# Patient Record
Sex: Female | Born: 1940 | Race: Black or African American | Hispanic: No | Smoking: Never smoker
Health system: Southern US, Community
[De-identification: ages and names within clinical notes are randomized; demographics above are authoritative.]

## PROBLEM LIST (undated history)

## (undated) DIAGNOSIS — N3281 Overactive bladder: Secondary | ICD-10-CM

## (undated) DIAGNOSIS — G4700 Insomnia, unspecified: Secondary | ICD-10-CM

## (undated) DIAGNOSIS — H9190 Unspecified hearing loss, unspecified ear: Secondary | ICD-10-CM

## (undated) DIAGNOSIS — F32A Depression, unspecified: Secondary | ICD-10-CM

## (undated) DIAGNOSIS — M179 Osteoarthritis of knee, unspecified: Secondary | ICD-10-CM

## (undated) DIAGNOSIS — I10 Essential (primary) hypertension: Secondary | ICD-10-CM

## (undated) DIAGNOSIS — E785 Hyperlipidemia, unspecified: Secondary | ICD-10-CM

## (undated) DIAGNOSIS — F329 Major depressive disorder, single episode, unspecified: Secondary | ICD-10-CM

## (undated) DIAGNOSIS — F419 Anxiety disorder, unspecified: Secondary | ICD-10-CM

## (undated) DIAGNOSIS — M1990 Unspecified osteoarthritis, unspecified site: Secondary | ICD-10-CM

## (undated) DIAGNOSIS — T7840XA Allergy, unspecified, initial encounter: Secondary | ICD-10-CM

## (undated) DIAGNOSIS — E559 Vitamin D deficiency, unspecified: Secondary | ICD-10-CM

## (undated) DIAGNOSIS — M545 Low back pain, unspecified: Secondary | ICD-10-CM

## (undated) HISTORY — DX: Unspecified osteoarthritis, unspecified site: M19.90

## (undated) HISTORY — DX: Osteoarthritis of knee, unspecified: M17.9

## (undated) HISTORY — DX: Low back pain, unspecified: M54.50

## (undated) HISTORY — DX: Anxiety disorder, unspecified: F41.9

## (undated) HISTORY — DX: Unspecified hearing loss, unspecified ear: H91.90

## (undated) HISTORY — DX: Depression, unspecified: F32.A

## (undated) HISTORY — DX: Hyperlipidemia, unspecified: E78.5

## (undated) HISTORY — DX: Major depressive disorder, single episode, unspecified: F32.9

## (undated) HISTORY — DX: Overactive bladder: N32.81

## (undated) HISTORY — DX: Essential (primary) hypertension: I10

## (undated) HISTORY — DX: Allergy, unspecified, initial encounter: T78.40XA

## (undated) HISTORY — DX: Vitamin D deficiency, unspecified: E55.9

## (undated) HISTORY — DX: Insomnia, unspecified: G47.00

---

## 1970-05-14 HISTORY — PX: ABDOMINAL HYSTERECTOMY: SHX81

## 1998-11-09 ENCOUNTER — Other Ambulatory Visit: Admission: RE | Admit: 1998-11-09 | Discharge: 1998-11-09 | Payer: Self-pay | Admitting: Podiatry

## 2000-02-02 ENCOUNTER — Ambulatory Visit (HOSPITAL_COMMUNITY): Admission: RE | Admit: 2000-02-02 | Discharge: 2000-02-02 | Payer: Self-pay | Admitting: Family Medicine

## 2000-02-02 ENCOUNTER — Encounter: Payer: Self-pay | Admitting: Family Medicine

## 2003-10-14 ENCOUNTER — Other Ambulatory Visit: Admission: RE | Admit: 2003-10-14 | Discharge: 2003-10-14 | Payer: Self-pay | Admitting: Family Medicine

## 2004-02-15 ENCOUNTER — Encounter: Admission: RE | Admit: 2004-02-15 | Discharge: 2004-02-15 | Payer: Self-pay | Admitting: Gastroenterology

## 2004-03-15 ENCOUNTER — Ambulatory Visit (HOSPITAL_COMMUNITY): Admission: RE | Admit: 2004-03-15 | Discharge: 2004-03-15 | Payer: Self-pay | Admitting: Gastroenterology

## 2004-03-15 ENCOUNTER — Encounter (INDEPENDENT_AMBULATORY_CARE_PROVIDER_SITE_OTHER): Payer: Self-pay | Admitting: Specialist

## 2004-03-22 ENCOUNTER — Observation Stay (HOSPITAL_COMMUNITY): Admission: RE | Admit: 2004-03-22 | Discharge: 2004-03-23 | Payer: Self-pay | Admitting: General Surgery

## 2004-03-22 ENCOUNTER — Encounter (INDEPENDENT_AMBULATORY_CARE_PROVIDER_SITE_OTHER): Payer: Self-pay | Admitting: Specialist

## 2004-10-12 HISTORY — PX: CHOLECYSTECTOMY: SHX55

## 2005-07-30 ENCOUNTER — Encounter (INDEPENDENT_AMBULATORY_CARE_PROVIDER_SITE_OTHER): Payer: Self-pay | Admitting: *Deleted

## 2005-07-30 ENCOUNTER — Ambulatory Visit (HOSPITAL_COMMUNITY): Admission: RE | Admit: 2005-07-30 | Discharge: 2005-07-30 | Payer: Self-pay | Admitting: Gastroenterology

## 2006-10-02 ENCOUNTER — Encounter: Admission: RE | Admit: 2006-10-02 | Discharge: 2006-10-02 | Payer: Self-pay | Admitting: Family Medicine

## 2006-10-09 ENCOUNTER — Encounter: Admission: RE | Admit: 2006-10-09 | Discharge: 2006-10-09 | Payer: Self-pay | Admitting: Family Medicine

## 2010-09-29 NOTE — Op Note (Signed)
Mariah Lindsey, Mariah Lindsey           ACCOUNT NO.:  [id number]   MEDICAL RECORD NO.:  [id number]          PATIENT TYPE:  AMB   LOCATION:  ENDO                         FACILITY:  [facility]   PHYSICIAN:  Anselmo Rod, M.D.  DATE OF BIRTH:  04/11/1941   DATE OF PROCEDURE:  03/15/2004  DATE OF DISCHARGE:                                 OPERATIVE REPORT   PROCEDURE PERFORMED:  Colonoscopy with snare polypectomy x1 and cold biopsy  x1.   ENDOSCOPIST:  Anselmo Rod, M.D.   INSTRUMENT USED:  Olympus video colonoscope.   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   PREPROCEDURE PREPARATION:  Informed consent was procured from the patient.  The patient fasted for eight hours prior to the procedure and prepped with a  bottle of magnesium citrate and a gallop of GoLYTELY the night prior to the  procedure.   PREPROCEDURE PHYSICAL:  VITAL SIGNS:  Stable vital signs.  NECK:  Supple.  CHEST:  Clear to auscultation.  CARDIOVASCULAR:  S1 and S2 regular.  ABDOMEN:  Soft with normal bowel sounds.   DESCRIPTION OF PROCEDURE:  The patient was placed in left lateral decubitus  position, sedated with 130 mg of Demerol and 17 mg of Versed in slow  incremental doses.  Once the patient was adequately sedated and maintained  on low flow oxygen and continuous cardiac monitoring, the Olympus video  colonoscope was advanced from the rectum to the cecum with extreme  difficulty.  The patient's position was changed from the left lateral to  supine, right lateral and prone positions to reach the cecum.  There was a  large amount of solid stool in certain areas of the colon.  Multiple  washings were done.  The appendiceal orifice and ileocecal valve were  clearly visualized and photographed where small lesions could have been  missed.  A large pedunculated polyp was snared from the  proximal right  colon. This was done carefully with cut and coag method.  Another  pedunculated polyp was seen in the mid right colon.  Epinephrine 3 mL were  injected in the base of the polyp and several attempts were made to remove  the polyp but there was a large amount of solid stool around the polyp.  Therefore, plans were made to reprep the patient and remove the polyp at  another time.  The small sessile polyp was biopsied from the distal right  colon.  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  Retroflexion in the rectum revealed no abnormalities. The patient tolerated  the procedure well without any immediate complications.   IMPRESSION:  1.  Large amount of solid stool in the colon, very limited examination.  2.  Large pedunculated polyp snared from proximal right colon.  3.  No other __________ or polyps snared from distal right colon.  4.  Melanosis coli noted throughout the colon.   RECOMMENDATIONS:  1.  Await pathology results.  2.  Avoid all nonsteroidals including aspirin for now.  3.  Outpatient follow-up after repeat colonoscopy.  4.  Further recommendations will be made at that time.       JNM/MEDQ  D:  03/15/2004  T:  03/15/2004  Job:  [id number]   cc:   Renaye Rakers, M.D.  [phone] [address], [address]  [place]  [place] [address]  Fax: [phone]   Leonie Man, M.D.  [address]  [address]  [place]  [place] [address]  Fax: [phone]

## 2010-09-29 NOTE — Op Note (Signed)
Mariah Lindsey, Mariah Lindsey           ACCOUNT NO.:  [id number]   MEDICAL RECORD NO.:  [id number]          PATIENT TYPE:  AMB   LOCATION:  DAY                          FACILITY:  [facility]   PHYSICIAN:  Leonie Man, M.D.   DATE OF BIRTH:  1940/09/16   DATE OF PROCEDURE:  03/22/2004  DATE OF DISCHARGE:                                 OPERATIVE REPORT   PREOPERATIVE DIAGNOSIS:  Chronic calculous cholecystitis.   POSTOPERATIVE DIAGNOSIS:  Chronic calculous cholecystitis.   PROCEDURE:  Laparoscopic cholecystectomy with intraoperative cholangiogram.   SURGEON:  Leonie Man, M.D.   ASSISTANT:  Currie Paris, M.D.   ANESTHESIA:  General.   NOTE:  Mariah Lindsey is a 70 year old obese patient referred courtesy of  Jyothi Nat Mann, M.D., for recurrent symptoms of upper abdominal pain  associated with nausea and vomiting.  She was evaluated by ultrasound and  noted to have cholelithiasis.  Her liver function studies on review are  within normal limits.  She has not had any chills or fever or jaundice.   The patient comes to the operating room now after the risks and potential  benefits of surgery have been fully discussed, all questions answered, and  consent obtained.   PROCEDURE:  Following the induction of satisfactory general anesthesia with  the patient positioned supinely, the abdomen was routinely prepped and  draped to be included in the sterile operative field.  The open laparoscopy  was created at the umbilicus with insertion of a Hasson cannula and  insufflation to 14 mmHg pressure using carbon dioxide.  On visual  exploration, the liver was somewhat encased in multiple adhesions along the  liver edge.  The liver edge itself was sharp.  There were some adhesions of  the duodenum and the stomach up to the undersurface of the liver.  None of  the small or large intestine otherwise viewed appeared to be normal.  Pelvic  organs were not visualized.   Under direct vision  epigastric and lateral ports were placed.  The patient  was then placed in the head-up position.  Adhesiolysis of the perihepatic  lesions was carried out.  The gallbladder was isolated and retracted  cephalad, dissection carried down to the region of the ampulla, where the  cystic artery and cystic duct were serially dissected free, the cystic  artery traced to its entry into the gallbladder wall, the cystic duct traced  to the gallbladder-cystic duct junction.  The cystic duct was then clipped  proximally and opened.  A cystic duct cholangiogram was then carried out  using a Reddick catheter, which was passed into the abdomen through a 14-  gauge Angiocath.  The resulting cholangiogram showed free flow of one-half  strength Hypaque directly into the duodenum with normal-caliber extrahepatic  and intrahepatic ducts.  There were no filling defects noted.  The  cholangiocatheter was removed and the cystic duct was doubly clipped and  transected, the cystic artery also doubly clipped and transected and the  gallbladder dissected free from the liver bed using electrocautery and  maintaining hemostasis throughout the course of the dissection.  At the end  of the dissection, all areas within the liver bed were checked for  hemostasis and noted to be dry.  The right upper quadrant was thoroughly  irrigated with multiple aliquots of normal saline and aspirated free.  The  gallbladder was placed in an EndoCatch and removed through the umbilical  wound without difficulty.  Sponge, instrument, and sharp counts were  verified and pneumoperitoneum allowed to deflate and the wounds closed in  layers as follows:  The umbilical wound in two layers with 0 Vicryl and 4-0  Monocryl, epigastric and lateral flank wound closed with 4-0 Monocryl  sutures.  All wounds were then reinforced with Dermabond, the anesthetic  reversed, the patient removed from the operating room to the recovery room  in stable  condition.  She tolerated the procedure well.     Patr   PB/MEDQ  D:  03/22/2004  T:  03/22/2004  Job:  [id number]   cc:   Anselmo Rod, M.D.  [address]  [place]  [place] [address]  Fax: [phone]

## 2010-09-29 NOTE — Op Note (Signed)
[name], Mariah Lindsey           ACCOUNT NO.:  [id number]   MEDICAL RECORD NO.:  [id number]          PATIENT TYPE:  AMB   LOCATION:  ENDO                         FACILITY:  [facility]   PHYSICIAN:  Anselmo Rod, M.D.  DATE OF BIRTH:  [birth year]   DATE OF PROCEDURE:  07/30/2005  DATE OF DISCHARGE:                                 OPERATIVE REPORT   PROCEDURE PERFORMED:  Colonoscopy with snare polypectomy x2.   ENDOSCOPIST:  Charna Elizabeth, M.D.   INSTRUMENT USED:  Olympus video colonoscope.   INDICATIONS FOR PROCEDURE:  A 70 year old African-American female with  history of left lower quadrant pain and a personal history of adenomatous  polyps, undergoing a repeat colonoscopy for surveillance purposes.  Rule out  recurrent polyps.   PREPROCEDURE PREPARATION:  Informed consent was procured from the patient.  The patient was fasted for four hours prior to the procedure and prepped  with OsmoPrep pills the night of and the morning of the procedure.  The  risks and benefits of the procedure including a 10% miss rate for cancer or  polyps was discussed with the patient as well.  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 were discussed with her in  great detail.  She refused blood products if need arises but requested that  alternative plasma expanders be used if needed.   PREPROCEDURE PHYSICAL:  The patient had stable vital signs.  Neck supple.  Chest clear to auscultation.  S1 and S2 regular.  Abdomen soft with normal  bowel sounds.   DESCRIPTION OF PROCEDURE:  The patient was placed in left lateral decubitus  position and sedated with 150 mcg of fentanyl and 11 mg of Versed in slow  incremental doses.  Once the patient was adequately sedated and maintained  on low flow oxygen and continuous cardiac monitoring, the Olympus video  colonoscope was advanced from the rectum to the cecum with difficulty.  The  patient had a somewhat tortuous colon.  The  appendicular orifice and  ileocecal valve were clearly visualized and photographed. There was some  residual stool in the colon.  Multiple washes were done.  A small sessile  polyp was removed by a hot snare from the proximal right colon.  A  pedunculated polyp was removed from the distal right colon by snare  polypectomy as well (hot snare x1).  There were a few early scattered  diverticula noted.  No masses or polyps were seen besides the ones mentioned  in the right colon.  Transverse colon and left colon appeared relatively  normal.  Retroflexion in the rectum revealed no abnormalities.   IMPRESSION:  1.  Two polyps, one sessile and one pedunculated removed by hot snare from      the proximal right colon.  2.  Few early scattered diverticula.  3.  No abnormalities noted on retroflexion in the rectum.   RECOMMENDATIONS:  1.  Await pathology results.  2.  Avoid all nonsteroidals including aspirin for now, especially over the      next four weeks.  3.  Repeat colonoscopy depending on pathology results.  4.  A high fiber diet with liberal fluid intake, brochures on diverticulosis      have been given for patient's education.  5.  Outpatient followup as need arises in the future.      Anselmo Rod, M.D.  Electronically Signed     JNM/MEDQ  D:  07/30/2005  T:  07/31/2005  Job:  [id number]   cc:   Renaye Rakers, M.D.  Fax: [phone]

## 2011-06-04 ENCOUNTER — Encounter: Payer: Self-pay | Admitting: *Deleted

## 2011-06-05 ENCOUNTER — Encounter: Payer: Self-pay | Admitting: *Deleted

## 2011-06-18 ENCOUNTER — Encounter: Payer: Medicare Other | Admitting: *Deleted

## 2011-06-18 ENCOUNTER — Encounter: Payer: Medicare Other | Attending: Internal Medicine | Admitting: *Deleted

## 2011-06-18 DIAGNOSIS — Z713 Dietary counseling and surveillance: Secondary | ICD-10-CM | POA: Insufficient documentation

## 2011-06-18 DIAGNOSIS — E119 Type 2 diabetes mellitus without complications: Secondary | ICD-10-CM | POA: Insufficient documentation

## 2011-06-20 ENCOUNTER — Encounter: Payer: Self-pay | Admitting: *Deleted

## 2011-06-20 NOTE — Progress Notes (Signed)
  Patient was seen on 06/18/11 for the first of a series of three diabetes self-management courses at the [facility]. The following learning objectives were met by the patient during this course:   Defines the role of glucose and insulin  Identifies type of diabetes and pathophysiology  Defines the diagnostic criteria for diabetes and prediabetes  States the risk factors for Type 2 Diabetes  States the symptoms of Type 2 Diabetes  Defines Type 2 Diabetes treatment goals  Defines Type 2 Diabetes treatment options  States the rationale for glucose monitoring  Identifies A1C, glucose targets, and testing times  Identifies proper sharps disposal  Defines the purpose of a diabetes food plan  Identifies carbohydrate food groups  Defines effects of carbohydrate foods on glucose levels  Identifies carbohydrate choices/grams/food labels  States benefits of physical activity and effect on glucose  Review of suggested activity guidelines  Last A1c: Unknown - Pt reports "I think it's a 10-something"  Handouts given during class include:  Type 2 Diabetes: Basics Book  My Food Plan Book  Food and Activity Log  My Plate Planner  Patient has established the following initial goals:  Increase exercise.  Follow a diabetes meal plan.  Monitor blood glucose.  Take medications appropriately.  Keep doctor's appointments.  Lose weight.  Follow-Up Plan: Patient to schedule Core Diabetes Courses II and III or follow up prn.

## 2011-06-20 NOTE — Patient Instructions (Signed)
Patient to schedule Core Diabetes Courses II and III or follow up prn.  

## 2011-07-17 DIAGNOSIS — Z79899 Other long term (current) drug therapy: Secondary | ICD-10-CM | POA: Diagnosis not present

## 2011-07-17 DIAGNOSIS — I10 Essential (primary) hypertension: Secondary | ICD-10-CM | POA: Diagnosis not present

## 2011-07-17 DIAGNOSIS — IMO0001 Reserved for inherently not codable concepts without codable children: Secondary | ICD-10-CM | POA: Diagnosis not present

## 2011-07-24 ENCOUNTER — Encounter: Payer: Medicare Other | Attending: Internal Medicine | Admitting: Dietician

## 2011-07-24 DIAGNOSIS — Z713 Dietary counseling and surveillance: Secondary | ICD-10-CM | POA: Insufficient documentation

## 2011-07-24 DIAGNOSIS — E119 Type 2 diabetes mellitus without complications: Secondary | ICD-10-CM | POA: Diagnosis not present

## 2011-07-25 NOTE — Progress Notes (Signed)
  Patient was seen on 07/24/2011 for the second of a series of three diabetes self-management courses at the [facility]. The following learning objectives were met by the patient during this course:   Explain basic nutrition maintenance and quality assurance  Describe causes, symptoms and treatment of hypoglycemia and hyperglycemia  Explain how to manage diabetes during illness  Describe the importance of good nutrition for health and healthy eating strategies  List strategies to follow meal plan when dining out  Describe the effects of alcohol on glucose and how to use it safely  Describe problem solving skills for day-to-day glucose challenges  Describe strategies to use when treatment plan needs to change  Identify important factors involved in successful weight loss  Describe ways to remain physically active  Describe the impact of regular activity on insulin resistance  Patient updated their initials goals from Core Class I to include:Agreed to contemplate her goal and finalize goals at Core Class 3.  ReliOnGlucose tablets given in class (1 packet);  Discussed the use of glucose tablets, but did not distribute a sample.  Handouts given in class:  Refrigerator magnet for Sick Day Guidelines  [facility] Oral medication/insulin handout  Follow-Up Plan: Patient will attend the final class of the ADA Diabetes Self-Care Education.

## 2011-07-31 ENCOUNTER — Ambulatory Visit: Payer: Self-pay

## 2011-08-28 ENCOUNTER — Encounter: Payer: Medicare Other | Attending: Internal Medicine | Admitting: Dietician

## 2011-08-28 DIAGNOSIS — Z713 Dietary counseling and surveillance: Secondary | ICD-10-CM | POA: Diagnosis not present

## 2011-08-28 DIAGNOSIS — E119 Type 2 diabetes mellitus without complications: Secondary | ICD-10-CM | POA: Diagnosis not present

## 2011-08-30 NOTE — Progress Notes (Signed)
  Patient was seen on 08/28/2011 for the third of a series of three diabetes self-management courses at the [facility]. The following learning objectives were met by the patient during this course:    Describe how diabetes changes over time   Identify diabetes complications and ways to prevent them   Describe strategies that can promote heart health including lowering blood pressure and cholesterol   Describe strategies to lower dietary fat and sodium in the diet   Identify physical activities that benefit cardiovascular health   Evaluate success in meeting personal goal   Describe the belief that they can live successfully with diabetes day to day   Establish 2-3 goals that they will plan to diligently work on until they return for the free 4-month follow-up visit  The following handouts were given in class:  3 Month Follow Up Visit handout  Goal setting handout  Class evaluation form  Your patient has established the following 3 month goals for diabetes self-care:  Reduce fat intake in my diet  Take my medication in the morning around 10:00 AM  Test my glucose at least 1 time per day.  Follow-Up Plan: Patient will attend a 3 month follow-up visit for diabetes self-management education.

## 2011-09-07 DIAGNOSIS — Z79899 Other long term (current) drug therapy: Secondary | ICD-10-CM | POA: Diagnosis not present

## 2011-09-07 DIAGNOSIS — M25569 Pain in unspecified knee: Secondary | ICD-10-CM | POA: Diagnosis not present

## 2011-09-07 DIAGNOSIS — I10 Essential (primary) hypertension: Secondary | ICD-10-CM | POA: Diagnosis not present

## 2011-09-13 DIAGNOSIS — H2510 Age-related nuclear cataract, unspecified eye: Secondary | ICD-10-CM | POA: Diagnosis not present

## 2011-12-04 DIAGNOSIS — I10 Essential (primary) hypertension: Secondary | ICD-10-CM | POA: Diagnosis not present

## 2011-12-04 DIAGNOSIS — E119 Type 2 diabetes mellitus without complications: Secondary | ICD-10-CM | POA: Diagnosis not present

## 2011-12-04 DIAGNOSIS — Z79899 Other long term (current) drug therapy: Secondary | ICD-10-CM | POA: Diagnosis not present

## 2011-12-12 ENCOUNTER — Ambulatory Visit: Payer: Self-pay | Admitting: Dietician

## 2011-12-15 ENCOUNTER — Ambulatory Visit (INDEPENDENT_AMBULATORY_CARE_PROVIDER_SITE_OTHER): Payer: Medicare Other | Admitting: Family Medicine

## 2011-12-15 VITALS — BP 130/90 | HR 76 | Temp 98.1°F | Resp 16 | Ht 64.0 in | Wt 205.0 lb

## 2011-12-15 DIAGNOSIS — M549 Dorsalgia, unspecified: Secondary | ICD-10-CM

## 2011-12-15 DIAGNOSIS — IMO0001 Reserved for inherently not codable concepts without codable children: Secondary | ICD-10-CM

## 2011-12-15 DIAGNOSIS — R7309 Other abnormal glucose: Secondary | ICD-10-CM

## 2011-12-15 DIAGNOSIS — M546 Pain in thoracic spine: Secondary | ICD-10-CM

## 2011-12-15 DIAGNOSIS — R1011 Right upper quadrant pain: Secondary | ICD-10-CM

## 2011-12-15 DIAGNOSIS — R739 Hyperglycemia, unspecified: Secondary | ICD-10-CM

## 2011-12-15 LAB — POCT CBC
Granulocyte percent: 43.9 %G (ref 37–80)
HCT, POC: 43.5 % (ref 37.7–47.9)
Hemoglobin: 13.1 g/dL (ref 12.2–16.2)
MCV: 94 fL (ref 80–97)
POC Granulocyte: 2.2 (ref 2–6.9)
RBC: 4.63 M/uL (ref 4.04–5.48)
RDW, POC: 13.9 %

## 2011-12-15 LAB — GLUCOSE, POCT (MANUAL RESULT ENTRY): POC Glucose: 239 mg/dl — AB (ref 70–99)

## 2011-12-15 MED ORDER — METHOCARBAMOL 750 MG PO TABS: 750.0000 mg | ORAL_TABLET | Freq: Four times a day (QID) | ORAL | Status: DC

## 2011-12-15 NOTE — Progress Notes (Signed)
Subjective: 71 year old lady who presents complaining of right upper back pain and tightness. It started about a month ago across the upper part of her shoulders on both sides, but it seems to have settled down into the right back around the area of the scapula. It hurts her when she moves her neck certain ways also. She has little bit of a stiff neck. She has had her gallbladder out. She denies nausea or vomiting. She has chronic foot pain problems for which she's had surgery and sees a podiatrist. 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 She has a history of being prediabetic with an A1c of 6.6 at the last time. Was tried on metformin but could not take it. Was then on Januvia, but is no longer on that.  Objective: Pleasant older alert lady in no acute distress. Her TMs are normal. Throat clear. Neck supple. Chest clear. Heart regular. Stat tenderness of the upper back, especially in the muscles medial to the scapula, just inferior and superior to the scapula. Range of motion of the neck is fair she is a little stiff and limited. Was soft without masses or lesions, but she does have some tenderness in the right upper quadrant.  Assessment: Right upper back Hyperglycemia Right upper quadrant abdominal tenderness  Plan: CBC, glucose, A1c  Results for orders placed in visit on 12/15/11  POCT CBC      Component Value Range   WBC 5.1  4.6 - 10.2 K/uL   Lymph, poc 2.5  0.6 - 3.4   POC LYMPH PERCENT 48.6  10 - 50 %L   MID (cbc) 0.4  0 - 0.9   POC MID % 7.5  0 - 12 %M   POC Granulocyte 2.2  2 - 6.9   Granulocyte percent 43.9  37 - 80 %G   RBC 4.63  4.04 - 5.48 M/uL   Hemoglobin 13.1  12.2 - 16.2 g/dL   HCT, POC 45.4  09.8 - 47.9 %   MCV 94.0  80 - 97 fL   MCH, POC 28.3  27 - 31.2 pg   MCHC 30.1 (*) 31.8 - 35.4 g/dL   RDW, POC 11.9     Platelet Count, POC 208  142 - 424 K/uL   MPV 11.9  0 - 99.8 fL  GLUCOSE, POCT (MANUAL RESULT ENTRY)      Component  Value Range   POC Glucose 239 (*) 70 - 99 mg/dl   Did not do the J4N since she has had one recently. With the high blood sugar she needs to contact her physician to get under treatment for diabetes.   She has .meloxicam at home, and will have her take thata regular basis along with the muscle relaxants for the next few days or week or so and see a she does

## 2011-12-15 NOTE — Patient Instructions (Signed)
Talk to your doctor about the blood sugar of 239. You need to be on medicine for your diabetes. You need to work hard on exercise and weight loss.  Use the meloxicam that you have for you knee and take one daily for your back and shoulder.  Robaxin 750 mg one tablet or 4 times a day for muscle relaxants for your back and shoulder.  If you're doing worse or not doing better come back

## 2011-12-25 DIAGNOSIS — M1710 Unilateral primary osteoarthritis, unspecified knee: Secondary | ICD-10-CM | POA: Diagnosis not present

## 2011-12-25 DIAGNOSIS — M25569 Pain in unspecified knee: Secondary | ICD-10-CM | POA: Diagnosis not present

## 2012-05-30 ENCOUNTER — Other Ambulatory Visit: Payer: Self-pay | Admitting: Family Medicine

## 2012-06-10 DIAGNOSIS — Z1231 Encounter for screening mammogram for malignant neoplasm of breast: Secondary | ICD-10-CM | POA: Diagnosis not present

## 2013-06-02 ENCOUNTER — Ambulatory Visit (INDEPENDENT_AMBULATORY_CARE_PROVIDER_SITE_OTHER): Payer: Medicare Other | Admitting: Family Medicine

## 2013-06-02 ENCOUNTER — Encounter: Payer: Self-pay | Admitting: Family Medicine

## 2013-06-02 VITALS — BP 150/90 | HR 76 | Ht 63.0 in | Wt 196.9 lb

## 2013-06-02 DIAGNOSIS — I10 Essential (primary) hypertension: Secondary | ICD-10-CM | POA: Diagnosis not present

## 2013-06-02 DIAGNOSIS — E119 Type 2 diabetes mellitus without complications: Secondary | ICD-10-CM | POA: Diagnosis not present

## 2013-06-02 DIAGNOSIS — F411 Generalized anxiety disorder: Secondary | ICD-10-CM | POA: Diagnosis not present

## 2013-06-02 LAB — POCT GLYCOSYLATED HEMOGLOBIN (HGB A1C): Hemoglobin A1C: 6.6

## 2013-06-02 MED ORDER — VALSARTAN-HYDROCHLOROTHIAZIDE 160-12.5 MG PO TABS: 1.0000 | ORAL_TABLET | Freq: Every day | ORAL | Status: DC

## 2013-06-02 MED ORDER — METFORMIN HCL 500 MG PO TABS: 500.0000 mg | ORAL_TABLET | Freq: Two times a day (BID) | ORAL | Status: AC

## 2013-06-02 NOTE — Patient Instructions (Signed)
It was nice meeting you today!   For your blood pressure, please check it at home every other day and follow up with either me or a different doctor of your choice in 1 month to make sure that we don't need to adjust your medicine.   I will call you if any of the labwork is abnormal and send a letter in the mail with the results.

## 2013-06-02 NOTE — Assessment & Plan Note (Signed)
explained clinic policy that we do not prescribe benzos or narcotics at the first visit.  - will not refill xanax.

## 2013-06-02 NOTE — Assessment & Plan Note (Signed)
not optimally controlled in patient with DM. No evidence of end organ damage. Compliance is a question.  - recommended checking BP's at home and following up in 1 month with either me or another practice of her choice. - will get BMP in setting of refilling medicine - refill hctz/losartan

## 2013-06-02 NOTE — Assessment & Plan Note (Signed)
-   check A1C - refilled metformin - checking Cr to make sure metformin refill is safe

## 2013-06-02 NOTE — Progress Notes (Signed)
Patient ID: Mariah Lindsey, female   DOB: 05-20-1940, 73 y.o.   MRN: [id number]  Chief Complaint  Patient presents with  . meet new doctor  . Hypertension    Hypertension Pertinent negatives include no chest pain, palpitations or shortness of breath.   Mariah Lindsey is a 73 y.o. female with h/o HTN and diabetes who presents to establish care. She [facility] because she just learned that she would be changing doctors every 3 years.  She is changing offices because her current office is too far from her house. She needs refills on her BP medicine, metformin and xanax.   # Hypertension: Medications: valsartan 160, hctz 12.5mg  daily Number of doses missed in 1 week: 1-2, last BP med taken last night BP at home: 130's systolic, never higher than 150 Exercise routine: none Chest pain: none Lower extremity swelling: none Shortness of breath: none Headache: none Change in vision: none Last BMP and lipid panel: patient reports BMP taken last October and normal.   # Diabetes: - medications: metformin 500mg  bid - missed doses in 1 week: none - hypoglycemic events: none - changes in vision? no - numbness or tingling in lower extremities? no - sores or ulcers? no - seen by podiatry? No  Past Medical History  Diagnosis Date  . Diabetes mellitus   . Hypertension   . Anxiety     Past Surgical History  Procedure Laterality Date  . Cholecystectomy  10/2004  . Abdominal hysterectomy  1972    ovaries remain, hysterectomy done for "precancerous cells", never diagnosed with malignancy    Family History  Problem Relation Age of Onset  . Asthma Other   . Cancer Other     other brother with leukemia, died at age 676yo  . Cancer Mother     metastatic, unclear source  . Hypertension Mother   . Cancer Sister     lung cancer  . Cancer Brother     prostate cancer    Social History History  Substance Use Topics  .  Smoking status: Never Smoker   . Smokeless tobacco: Never Used  . 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  She lives with her son and daughter   Allergies  Allergen Reactions  . Benadryl [Diphenhydramine Hcl]     Unknown if all Benedryl products    Current Outpatient Prescriptions  Medication Sig Dispense Refill  . ALPRAZolam (XANAX) 0.25 MG tablet Take 0.25 mg by mouth daily.      . metFORMIN (GLUCOPHAGE) 500 MG tablet Take 1 tablet (500 mg total) by mouth 2 (two) times daily with a meal.  60 tablet  3  . valsartan-hydrochlorothiazide (DIOVAN HCT) 160-12.5 MG per tablet Take 1 tablet by mouth daily.  30 tablet  3   No current facility-administered medications for this visit.    Review of Systems Review of Systems  Constitutional: Negative for fever, chills, activity change and fatigue.  HENT: Negative for congestion and sinus pressure.   Eyes: Negative for pain and visual disturbance.  Respiratory: Negative for cough, chest tightness and shortness of breath.   Cardiovascular: Negative for chest pain, palpitations and leg swelling.  Gastrointestinal: Negative for abdominal pain, constipation, blood in stool and rectal pain.  Endocrine: Negative for polydipsia.  Increased urinary frequency since started on hctz  Genitourinary: Positive for frequency. Negative for dysuria.  Musculoskeletal: Negative for arthralgias and back pain.       Knee pain in right more than left from osteoarthritis. chronic  Skin: Negative for rash.  Neurological: Negative for dizziness and numbness.    Blood pressure 150/90, pulse 76, height 5\' 3"  (1.6 m), weight 196 lb 14.4 oz (89.313 kg).  Physical Exam Physical Exam Physical Examination: General appearance - alert, well appearing, and in no distress Nose - normal and patent, no erythema, discharge or polyps Mouth  - mucous membranes moist, pharynx normal, torus palatinus present Neck - supple, no significant adenopathy Chest - clear to auscultation, no wheezes, rales or rhonchi, symmetric air entry Heart - normal rate, regular rhythm, normal S1, S2, no murmurs, rubs, clicks or gallops Abdomen - soft, nontender, nondistended, no masses or organomegaly Extremities - peripheral pulses normal, no pedal edema Skin - normal coloration and turgor, no rashes, no suspicious skin lesions noted  Data Reviewed none   Assessment   73 yo female with h/o HTN, diabetes and anxiety who presents to establish care. Unclear whether she will continue being seen by [place].     Plan    # Hypertension: not optimally controlled in patient with DM. No evidence of end organ damage. Compliance is a question.  - recommended checking BP's at home and following up in 1 month with either me or another practice of her choice. - will get BMP in setting of refilling medicine - refill hctz/losartan  # Diabetes: on metformin - check A1C - refilled metformin - checking Cr to make sure metformin refill is safe  # Anxiety: explained clinic policy that we do not prescribe benzos or narcotics at the first visit.  - will not refill xanax.        Marena Chancy 06/02/2013, 5:29 PM

## 2013-06-03 ENCOUNTER — Encounter: Payer: Self-pay | Admitting: Family Medicine

## 2013-06-03 LAB — BASIC METABOLIC PANEL
BUN: 15 mg/dL (ref 6–23)
CHLORIDE: 100 meq/L (ref 96–112)
CO2: 30 mEq/L (ref 19–32)
CREATININE: 0.82 mg/dL (ref 0.50–1.10)
Calcium: 9.8 mg/dL (ref 8.4–10.5)
Glucose, Bld: 126 mg/dL — ABNORMAL HIGH (ref 70–99)
Potassium: 4.1 mEq/L (ref 3.5–5.3)
Sodium: 139 mEq/L (ref 135–145)

## 2013-07-16 ENCOUNTER — Ambulatory Visit (INDEPENDENT_AMBULATORY_CARE_PROVIDER_SITE_OTHER): Payer: Medicare Other | Admitting: Family Medicine

## 2013-07-16 ENCOUNTER — Encounter: Payer: Self-pay | Admitting: Family Medicine

## 2013-07-16 VITALS — BP 177/94 | HR 94 | Ht 63.0 in | Wt 197.0 lb

## 2013-07-16 DIAGNOSIS — I10 Essential (primary) hypertension: Secondary | ICD-10-CM

## 2013-07-16 DIAGNOSIS — F411 Generalized anxiety disorder: Secondary | ICD-10-CM

## 2013-07-16 MED ORDER — VALSARTAN-HYDROCHLOROTHIAZIDE 160-25 MG PO TABS: 1.0000 | ORAL_TABLET | Freq: Every day | ORAL | Status: DC

## 2013-07-16 MED ORDER — ALPRAZOLAM 0.25 MG PO TABS: 0.2500 mg | ORAL_TABLET | Freq: Every day | ORAL | Status: DC

## 2013-07-16 NOTE — Assessment & Plan Note (Signed)
Explained my hesitation to prescribe xanax in older patients and their associated risk of falls. Patient expressed understanding of this. Since she has been on this on a somewhat regular basis, will refill once a day.  At follow up will obtain phq9 and see about trying SSRI.  Reviewed light headedness, dizziness... Take it sparingly only as needed.

## 2013-07-16 NOTE — Patient Instructions (Signed)
Let's increase the hctz to 25mg . Follow up in 2 weeks for repeat blood pressure and labwork.   Start back with exercise routine which will be very helpful.   We;ll also check and see how your anxiety is doing.   2 Gram Low Sodium Diet A 2 gram sodium diet restricts the amount of sodium in the diet to no more than 2 g or 2000 mg daily. Limiting the amount of sodium is often used to help lower blood pressure. It is important if you have heart, liver, or kidney problems. Many foods contain sodium for flavor and sometimes as a preservative. When the amount of sodium in a diet needs to be low, it is important to know what to look for when choosing foods and drinks. The following includes some information and guidelines to help make it easier for you to adapt to a low sodium diet. QUICK TIPS  Do not add salt to food.  Avoid convenience items and fast food.  Choose unsalted snack foods.  Buy lower sodium products, often labeled as "lower sodium" or "no salt added."  Check food labels to learn how much sodium is in 1 serving.  When eating at a restaurant, ask that your food be prepared with less salt or none, if possible. READING FOOD LABELS FOR SODIUM INFORMATION The nutrition facts label is a good place to find how much sodium is in foods. Look for products with no more than 500 to 600 mg of sodium per meal and no more than 150 mg per serving. Remember that 2 g = 2000 mg. The food label may also list foods as:  Sodium-free: Less than 5 mg in a serving.  Very low sodium: 35 mg or less in a serving.  Low-sodium: 140 mg or less in a serving.  Light in sodium: 50% less sodium in a serving. For example, if a food that usually has 300 mg of sodium is changed to become light in sodium, it will have 150 mg of sodium.  Reduced sodium: 25% less sodium in a serving. For example, if a food that usually has 400 mg of sodium is changed to reduced sodium, it will have 300 mg of sodium. CHOOSING  FOODS Grains  Avoid: Salted crackers and snack items. Some cereals, including instant hot cereals. Bread stuffing and biscuit mixes. Seasoned rice or pasta mixes.  Choose: Unsalted snack items. Low-sodium cereals, oats, puffed wheat and rice, shredded wheat. English muffins and bread. Pasta. Meats  Avoid: Salted, canned, smoked, spiced, pickled meats, including fish and poultry. Bacon, ham, sausage, cold cuts, hot dogs, anchovies.  Choose: Low-sodium canned tuna and salmon. Fresh or frozen meat, poultry, and fish. Dairy  Avoid: Processed cheese and spreads. Cottage cheese. Buttermilk and condensed milk. Regular cheese.  Choose: Milk. Low-sodium cottage cheese. Yogurt. Sour cream. Low-sodium cheese. Fruits and Vegetables  Avoid: Regular canned vegetables. Regular canned tomato sauce and paste. Frozen vegetables in sauces. Olives. Rosita FirePickles. Relishes. Sauerkraut.  Choose: Low-sodium canned vegetables. Low-sodium tomato sauce and paste. Frozen or fresh vegetables. Fresh and frozen fruit. Condiments  Avoid: Canned and packaged gravies. Worcestershire sauce. Tartar sauce. Barbecue sauce. Soy sauce. Steak sauce. Ketchup. Onion, garlic, and table salt. Meat flavorings and tenderizers.  Choose: Fresh and dried herbs and spices. Low-sodium varieties of mustard and ketchup. Lemon juice. Tabasco sauce. Horseradish. SAMPLE 2 GRAM SODIUM MEAL PLAN Breakfast / Sodium (mg)  1 cup low-fat milk / 143 mg  2 slices whole-wheat toast / 270 mg  1 tbs  heart-healthy margarine / 153 mg  1 hard-boiled egg / 139 mg  1 small orange / 0 mg Lunch / Sodium (mg)  1 cup raw carrots / 76 mg   cup hummus / 298 mg  1 cup low-fat milk / 143 mg   cup red grapes / 2 mg  1 whole-wheat pita bread / 356 mg Dinner / Sodium (mg)  1 cup whole-wheat pasta / 2 mg  1 cup low-sodium tomato sauce / 73 mg  3 oz lean ground beef / 57 mg  1 small side salad (1 cup raw spinach leaves,  cup cucumber,  cup  yellow bell pepper) with 1 tsp olive oil and 1 tsp red wine vinegar / 25 mg Snack / Sodium (mg)  1 container low-fat vanilla yogurt / 107 mg  3 graham cracker squares / 127 mg Nutrient Analysis  Calories: 2033  Protein: 77 g  Carbohydrate: 282 g  Fat: 72 g  Sodium: 1971 mg Document Released: 04/30/2005 Document Revised: 07/23/2011 Document Reviewed: 08/01/2009 Exit[employer] Patient Information 2014 [place], [place].

## 2013-07-16 NOTE — Progress Notes (Signed)
Patient ID: Mariah BreslowMadeline Lindsey    DOB: May 12, 1941, 73 y.o.   MRN: [id number] --- Subjective:  Sheran LawlessMadeline is a 73 y.o.female who presents for follow up on HTN.  # Hypertension: Medications: valsartan/hctz 160/12.5 Number of doses missed in 1 week: 0 BP at home: 160/78 last night, 140-150/70-80.  Exercise routine: none Salt in diet: present Chest pain: none Lower extremity swelling: none Shortness of breath: none Headache: occasional Change in vision: none  # Anxiety and phobia: has been on xanax for multiple years. Currently not taking it as she ran out 1 month ago. She takes it for sleep and for times when she is in anxiety situation like escalators or crowded elevators. She used to take zoloft which caused some tinnitus. She also was on celexa which did help. She denies any current feelings of sadness or loss of interest or SI/HI. She denies any recent falls.   ROS: see HPI Past Medical History: reviewed and updated medications and allergies. Social History: Tobacco: none  Objective: Filed Vitals:   07/16/13 1425  BP: 177/94  Pulse: 94   Repeat manual BP: 170/90  Physical Examination:   General appearance - alert, well appearing, and in no distress Mouth - mucous membranes moist, pharynx normal without lesions Chest - clear to auscultation, no wheezes, rales or rhonchi, symmetric air entry Heart - normal rate, regular rhythm, normal S1, S2, no murmurs Extremities - no pedal edema

## 2013-07-16 NOTE — Assessment & Plan Note (Addendum)
Still not adequately controled. Reviewed goal for patient: less than 150/90.  - will increase medication from valsartan 160/hctz 12.5 to valsartan 160/hctz 25mg .  - return to clinic in 2 weeks for repeat BP and BMP - low salt diet information given - reviewed importance of exercise. She is going to start back her TV exercise program.  - fasting labs at next appointment for lipid panel

## 2013-08-12 ENCOUNTER — Ambulatory Visit (INDEPENDENT_AMBULATORY_CARE_PROVIDER_SITE_OTHER): Payer: Medicare Other | Admitting: Family Medicine

## 2013-08-12 ENCOUNTER — Encounter: Payer: Self-pay | Admitting: Family Medicine

## 2013-08-12 VITALS — BP 160/86 | HR 60 | Ht 63.0 in | Wt 201.0 lb

## 2013-08-12 DIAGNOSIS — I10 Essential (primary) hypertension: Secondary | ICD-10-CM | POA: Diagnosis not present

## 2013-08-12 DIAGNOSIS — F411 Generalized anxiety disorder: Secondary | ICD-10-CM | POA: Diagnosis not present

## 2013-08-12 LAB — COMPREHENSIVE METABOLIC PANEL
ALK PHOS: 65 U/L (ref 39–117)
ALT: 13 U/L (ref 0–35)
AST: 13 U/L (ref 0–37)
Albumin: 4.3 g/dL (ref 3.5–5.2)
BILIRUBIN TOTAL: 0.5 mg/dL (ref 0.2–1.2)
BUN: 14 mg/dL (ref 6–23)
CO2: 29 mEq/L (ref 19–32)
Calcium: 9.5 mg/dL (ref 8.4–10.5)
Chloride: 102 mEq/L (ref 96–112)
Creat: 0.75 mg/dL (ref 0.50–1.10)
GLUCOSE: 150 mg/dL — AB (ref 70–99)
Potassium: 3.9 mEq/L (ref 3.5–5.3)
SODIUM: 138 meq/L (ref 135–145)
TOTAL PROTEIN: 7.1 g/dL (ref 6.0–8.3)

## 2013-08-12 LAB — LIPID PANEL
Cholesterol: 204 mg/dL — ABNORMAL HIGH (ref 0–200)
HDL: 49 mg/dL (ref >39)
LDL CALC: 107 mg/dL — AB (ref 0–99)
TRIGLYCERIDES: 240 mg/dL — AB (ref <150)
Total CHOL/HDL Ratio: 4.2 Ratio
VLDL: 48 mg/dL — AB (ref 0–40)

## 2013-08-12 MED ORDER — VENLAFAXINE HCL 37.5 MG PO TABS: 37.5000 mg | ORAL_TABLET | Freq: Two times a day (BID) | ORAL | Status: DC

## 2013-08-12 NOTE — Assessment & Plan Note (Signed)
H/o anxiety and depression. Patient has been on effexor in the past and would like to try it again.  Start 37.5mg  bid and monitor in 3-4 weeks. Patient to call if any trouble with medicine.  Stop xanax

## 2013-08-12 NOTE — Progress Notes (Signed)
Patient ID: Mariah BreslowMadeline Lindsey    DOB: [birth year], 73 y.o.   MRN: [id number] --- Subjective:  Mariah LawlessMadeline is a 73 y.o.female who presents for follow up on blood pressure and also wants to discuss starting effexor.   # Hypertension: Medications: valsartan/hctz 160/25. Dose was increased from 12.5 to 25 of hctz on 07/16/13.  Number of doses missed in 1 week: 0 BP at home: 134-140/70-76 Exercise routine: walking Salt in diet: present Chest pain: none Lower extremity swelling: none Shortness of breath: none Headache: with metformin use Change in vision: none  # Depression and anxiety:  Presenting Issue: Depression and anxiety  Report of symptoms: Anxiety with abdominal discomfort when coming to the doctor's office. Trouble sleeping. Worrisome thoughts  Duration of symptoms: Since childhood  Impact on function: Able to go to meetings and go to her appointments although it takes some effort to do so   Psychiatric History (include age of onset of first mood disturbance):  - Diagnoses: depression  - Treatment (include hospitalizations, pharmacotherapy and outpatient therapy): Zoloft: bad dreams and ear ringing Effexor: over 4 years ago. No side effects   Family history of psychiatric issues: none  Current and history of substance use: none  Medical conditions that might explain or contribute to symptoms: none  PHQ-9: 10 MDQ (if indicated):  none   ROS: see HPI Past Medical History: reviewed and updated medications and allergies. Social History: Tobacco: none  Objective: Filed Vitals:   08/12/13 1030  BP: 160/86  Pulse: 60   Repeat manual BP: 160/84  Physical Examination:   General appearance - alert, well appearing, and in no distress Chest - clear to auscultation, no wheezes, rales or rhonchi, symmetric air entry Heart - normal rate, regular rhythm, normal S1, S2, no murmurs

## 2013-08-12 NOTE — Patient Instructions (Signed)
Please follow up in 3-4 weeks.   If your blood pressure is over 160/90 on more than 2 occasions, give me a call.

## 2013-08-12 NOTE — Assessment & Plan Note (Signed)
Had long discussion with patient about titration of valsartan/hctz 160/25 up to 320/25 but patient appears hesitant. She would like to continue working on diet and exercise for 6-8 weeks and re-evaluate. Given BP at home around 130-140 systolic, this seems reasonable. Asked her to contact the office if BP at home is greater than 160/90 on 2 or more occasions.  Also discussed 24 hr BP monitoring but she is not wanting to do that at this point.  BMP for K in setting of increased HCTZ

## 2013-08-13 ENCOUNTER — Telehealth: Payer: Self-pay | Admitting: Family Medicine

## 2013-08-13 NOTE — Telephone Encounter (Signed)
Called patient and left message stating that I wanted to touch base about the lab results. Potassium looks good. Want to discuss cholesterol results but nothing urgent and can wait until next week.   Mariah ChancyStephanie Dwana Garin, PGY-3 Family Medicine Resident

## 2013-08-17 ENCOUNTER — Telehealth: Payer: Self-pay | Admitting: Family Medicine

## 2013-08-17 NOTE — Telephone Encounter (Signed)
Tried calling patient again with results of the labwork but did not get answer. Will try again at another time.   Marena ChancyStephanie Xandrea Clarey, PGY-3 Family Medicine Resident

## 2013-09-14 ENCOUNTER — Encounter: Payer: Self-pay | Admitting: Family Medicine

## 2013-09-15 ENCOUNTER — Ambulatory Visit: Payer: Self-pay | Admitting: Family Medicine

## 2013-10-09 ENCOUNTER — Ambulatory Visit (INDEPENDENT_AMBULATORY_CARE_PROVIDER_SITE_OTHER): Payer: Medicare Other | Admitting: Family Medicine

## 2013-10-09 VITALS — BP 183/76 | HR 104 | Temp 99.4°F | Wt 198.0 lb

## 2013-10-09 DIAGNOSIS — E119 Type 2 diabetes mellitus without complications: Secondary | ICD-10-CM | POA: Diagnosis not present

## 2013-10-09 DIAGNOSIS — I10 Essential (primary) hypertension: Secondary | ICD-10-CM

## 2013-10-09 DIAGNOSIS — F411 Generalized anxiety disorder: Secondary | ICD-10-CM | POA: Diagnosis not present

## 2013-10-09 MED ORDER — LISINOPRIL 10 MG PO TABS: 10.0000 mg | ORAL_TABLET | Freq: Every day | ORAL | Status: DC

## 2013-10-09 MED ORDER — CHLORTHALIDONE 25 MG PO TABS
25.0000 mg | ORAL_TABLET | Freq: Every day | ORAL | Status: AC
Start: 2013-10-09 — End: ?

## 2013-10-09 NOTE — Patient Instructions (Signed)
For the effexor, start taking it every other day. If you tolerate that, take it every 3 days.   For the blood pressure, we are starting lisinopril 10mg  and chlorthalidone 25mg  daily.   Follow up with me in 10 days to 2 weeks to check your blood pressure and lab work to make sure your potassium is ok.

## 2013-10-11 NOTE — Progress Notes (Signed)
Patient ID: Mariah Lindsey    DOB: Jul 16, 1940, 73 y.o.   MRN: [id number] --- Subjective:  Mariah Lindsey is a 73 y.o.female who presents for BP follow up. She has been taking her BP at home but cannot recall the exact numbers. SHe states that the bottom number has been above 90 on several occasions.  She has been taking valsartan/hydrochlorothiazide 160-25 but doesn't like taking it and would like to change to something different. She attributes recent hair loss to the hctz. She has been on lisinopril and hydrochlorothalidone in the past and has tolerated those medicines.  She denies any cp, sob, significant lower extr swelling or double vision or ha.   - anxiety: has been taking effexor and feels like she is getting hot flashes with it. She thinks her anxiety is better with it but she has unintentional yawning which is a side effect she doesn't want.  She did not take yesterday and today and feels a little jittery off of it.    ROS: see HPI Past Medical History: reviewed and updated medications and allergies. Social History: Tobacco: none  Objective: Filed Vitals:   10/09/13 1346  BP: 183/76  Pulse: 104  Temp: 99.4 F (37.4 C)    Physical Examination:   General appearance - alert, mildly anxious appearing, and in no distress Neck - supple, no significant adenopathy Chest - clear to auscultation, no wheezes, rales or rhonchi, symmetric air entry Heart - normal rate, regular rhythm, normal S1, S2, no murmurs Extremities - trace edema present

## 2013-10-12 NOTE — Assessment & Plan Note (Signed)
Patient would like to change medication regimen which we can attempt.  - d/c valsartan/hctz 160/25 and start lisinopril 10 and chlorthalidone 25mg . Patient to return to see me in 10days to 2 weeks for BP check and BMP.  - BP uncontrolled with asymptomatic patient.

## 2013-10-12 NOTE — Assessment & Plan Note (Signed)
Need to start patient on statin but since we are starting new medicines today and patient is sensitive to medication side effects, would like to minimize possible side effects.

## 2013-10-12 NOTE — Assessment & Plan Note (Signed)
Patient wanting to stop effexor. Recommended that she wean the medication as tolerated, starting by taking it every other day and increasing interval as she tolerates it.

## 2013-11-05 ENCOUNTER — Encounter: Payer: Self-pay | Admitting: Family Medicine

## 2013-11-05 ENCOUNTER — Ambulatory Visit (INDEPENDENT_AMBULATORY_CARE_PROVIDER_SITE_OTHER): Payer: Medicare Other | Admitting: Family Medicine

## 2013-11-05 VITALS — BP 178/82 | HR 75 | Temp 98.6°F | Wt 192.8 lb

## 2013-11-05 DIAGNOSIS — I10 Essential (primary) hypertension: Secondary | ICD-10-CM

## 2013-11-05 DIAGNOSIS — F411 Generalized anxiety disorder: Secondary | ICD-10-CM

## 2013-11-05 DIAGNOSIS — E119 Type 2 diabetes mellitus without complications: Secondary | ICD-10-CM

## 2013-11-05 LAB — BASIC METABOLIC PANEL
BUN: 16 mg/dL (ref 6–23)
CALCIUM: 9.6 mg/dL (ref 8.4–10.5)
CO2: 27 mEq/L (ref 19–32)
CREATININE: 0.81 mg/dL (ref 0.50–1.10)
Chloride: 105 mEq/L (ref 96–112)
Glucose, Bld: 176 mg/dL — ABNORMAL HIGH (ref 70–99)
POTASSIUM: 3.7 meq/L (ref 3.5–5.3)
Sodium: 137 mEq/L (ref 135–145)

## 2013-11-05 LAB — POCT GLYCOSYLATED HEMOGLOBIN (HGB A1C): Hemoglobin A1C: 7.1

## 2013-11-05 MED ORDER — BENAZEPRIL HCL 10 MG PO TABS: 10.0000 mg | ORAL_TABLET | Freq: Every day | ORAL | Status: DC

## 2013-11-05 NOTE — Assessment & Plan Note (Signed)
Not tolerating effexor due to excessive sweating.  Continue to wean as tolerated

## 2013-11-05 NOTE — Patient Instructions (Signed)
Please follow up in 2-3 weeks with a doctor for blood pressure.

## 2013-11-05 NOTE — Assessment & Plan Note (Addendum)
Patient is insistent on changing medication due to concerns that it is causing insomnia. She does have a history of being sensitive to medications which could be contributing. lisnopril associated in less than 1% of cases with insomnia.  Blood pressure elevated today likely secondary to patient not taking medications this morning. Her reported blood pressures at home do seem better controlled. Will switch her from lisinopril to benazepril 10 mg and monitor sleeping patterns. Continue chlorthalidone. Followup in 2 weeks Check BMP today in setting of newly started lisinopril and chlorthalidone. Reviewed importance of sleep hygiene.

## 2013-11-05 NOTE — Progress Notes (Signed)
Patient ID: Mariah BreslowMadeline Lindsey    DOB: [birth year], 73 y.o.   MRN: [id number] --- Subjective:  Mariah LawlessMadeline is a 73 y.o.female who presents for followup on blood pressure. -Hypertension: Patient was started on lisinopril and chlorthalidone last month after requesting to be changed from her prior medications stating that the hydrochlorothiazide she was previously on made her lose her hair. She has been taking the lisinopril and chlorthalidone and feels that these medications have been contributing to her recent insomnia. She has checked her blood pressures at home and they are in the 130s to 140s. She did not take her blood pressure medicines this morning. She denies any double vision, headache, chest pain, shortness of breath, lower extremity swelling.   - Insomnia: Ongoing for the last 3 weeks. Patient has been having trouble falling asleep. She then wakes up in the middle of the night at 3 AM and has difficulty falling back asleep. She does have the TV on occasionally before sleeping. She wakes up in the middle of the night to urinate. She has had trouble in the past but this was associated with anxiety and depression. She doesn't feel like she is currently depressed. She has been weaning off of the Effexor taking it every other day. She felt like the Effexor has caused increased and uncontrollable sweating.  ROS: see HPI Past Medical History: reviewed and updated medications and allergies. Social History: Tobacco: none  Objective: Filed Vitals:   11/05/13 1115  BP: 178/82  Pulse: 75  Temp: 98.6 F (37 C)    Physical Examination:   General appearance - alert, well appearing, and in no distress  Chest - clear to auscultation, no wheezes, rales or rhonchi, symmetric air entry Heart - normal rate, regular rhythm, normal S1, S2, no murmurs Extremities - no pedal edema

## 2013-11-06 ENCOUNTER — Encounter: Payer: Self-pay | Admitting: Family Medicine

## 2014-01-20 ENCOUNTER — Ambulatory Visit: Payer: Self-pay | Admitting: Family Medicine

## 2014-01-26 ENCOUNTER — Ambulatory Visit (INDEPENDENT_AMBULATORY_CARE_PROVIDER_SITE_OTHER): Payer: Medicare Other | Admitting: Family Medicine

## 2014-01-26 VITALS — BP 157/73 | HR 84 | Temp 98.5°F | Resp 18 | Wt 192.0 lb

## 2014-01-26 DIAGNOSIS — H9201 Otalgia, right ear: Secondary | ICD-10-CM | POA: Insufficient documentation

## 2014-01-26 DIAGNOSIS — F411 Generalized anxiety disorder: Secondary | ICD-10-CM

## 2014-01-26 DIAGNOSIS — H9209 Otalgia, unspecified ear: Secondary | ICD-10-CM

## 2014-01-26 MED ORDER — VENLAFAXINE HCL 37.5 MG PO TABS: 37.5000 mg | ORAL_TABLET | Freq: Two times a day (BID) | ORAL | Status: DC

## 2014-01-26 NOTE — Assessment & Plan Note (Signed)
Most likely related to cerumen impaction and previous tinnitus.  If continues after aural toilet, would consider referral back to ENT for further testing including sound differentiation as well as frequency and Weber/Rinne testing.

## 2014-01-26 NOTE — Progress Notes (Signed)
Mariah Lindsey is a 73 y.o. female who presents today for otalgia.  Pt states she has had ear pain in the R ear for about 4-5 days.  Has Hx of tinnitus previously and has seen ENT, at which point she had normal exam.  She has hx of cerumen impaction requiring aural toilet which usually helps.  She denies any mastoid tenderness, fever, chills, sweats, worsening tinnitus, ataxia, dizziness, blurred vision.   Past Medical History  Diagnosis Date  . Diabetes mellitus   . Hypertension   . Anxiety     History  Smoking status  . Never Smoker   Smokeless tobacco  . Never Used    Family History  Problem Relation Age of Onset  . Asthma Other   . Cancer Other     other brother with leukemia, died at age 38yo  . Cancer Mother     metastatic, unclear source  . Hypertension Mother   . Cancer Sister     lung cancer  . Cancer Brother     prostate cancer    Current Outpatient Prescriptions on File Prior to Visit  Medication Sig Dispense Refill  . benazepril (LOTENSIN) 10 MG tablet Take 1 tablet (10 mg total) by mouth daily.  30 tablet  3  . chlorthalidone (HYGROTON) 25 MG tablet Take 1 tablet (25 mg total) by mouth daily.  30 tablet  3  . metFORMIN (GLUCOPHAGE) 500 MG tablet Take 1 tablet (500 mg total) by mouth 2 (two) times daily with a meal.  60 tablet  3   No current facility-administered medications on file prior to visit.    ROS: Per HPI.  All other systems reviewed and are negative.   Physical Exam Filed Vitals:   01/26/14 1426  BP: 157/73  Pulse: 84  Temp: 98.5 F (36.9 C)  Resp: 18    Physical Examination: General appearance - alert, well appearing, and in no distress Ears - L TMI, normal appearing external canal.  R canal w/ augmented cerumen.  No mastoid tenderness B/L  Neurological - Romberg sign negative, normal gait and station, Cerebellar signs nml including FTN, heel to shin, and hand to palm.

## 2014-01-26 NOTE — Patient Instructions (Signed)
Cerumen Impaction °A cerumen impaction is when the wax in your ear forms a plug. This plug usually causes reduced hearing. Sometimes it also causes an earache or dizziness. Removing a cerumen impaction can be difficult and painful. The wax sticks to the ear canal. The canal is sensitive and bleeds easily. If you try to remove a heavy wax buildup with a cotton tipped swab, you may push it in further. °Irrigation with water, suction, and small ear curettes may be used to clear out the wax. If the impaction is fixed to the skin in the ear canal, ear drops may be needed for a few days to loosen the wax. People who build up a lot of wax frequently can use ear wax removal products available in your local drugstore. °SEEK MEDICAL CARE IF:  °You develop an earache, increased hearing loss, or marked dizziness. °Document Released: 06/07/2004 Document Revised: 07/23/2011 Document Reviewed: 07/28/2009 °Exit[employer]® Patient Information ©2015 [employer]. This information is not intended to replace advice given to you by your health care provider. Make sure you discuss any questions you have with your health care provider. ° °

## 2014-02-10 ENCOUNTER — Ambulatory Visit (INDEPENDENT_AMBULATORY_CARE_PROVIDER_SITE_OTHER): Payer: Medicare Other | Admitting: Family Medicine

## 2014-02-10 ENCOUNTER — Encounter: Payer: Self-pay | Admitting: Family Medicine

## 2014-02-10 VITALS — BP 170/88 | HR 81 | Temp 98.6°F | Ht 63.0 in | Wt 197.0 lb

## 2014-02-10 DIAGNOSIS — F411 Generalized anxiety disorder: Secondary | ICD-10-CM

## 2014-02-10 DIAGNOSIS — E119 Type 2 diabetes mellitus without complications: Secondary | ICD-10-CM | POA: Diagnosis not present

## 2014-02-10 DIAGNOSIS — I10 Essential (primary) hypertension: Secondary | ICD-10-CM

## 2014-02-10 LAB — POCT GLYCOSYLATED HEMOGLOBIN (HGB A1C): HEMOGLOBIN A1C: 7.2

## 2014-02-10 MED ORDER — BENAZEPRIL HCL 20 MG PO TABS: 20.0000 mg | ORAL_TABLET | Freq: Every day | ORAL | Status: DC

## 2014-02-10 MED ORDER — SERTRALINE HCL 25 MG PO TABS: 25.0000 mg | ORAL_TABLET | Freq: Every day | ORAL | Status: DC

## 2014-02-10 MED ORDER — SERTRALINE HCL 50 MG PO TABS: 50.0000 mg | ORAL_TABLET | Freq: Every day | ORAL | Status: DC

## 2014-02-10 MED ORDER — VENLAFAXINE HCL 37.5 MG PO TABS: 37.5000 mg | ORAL_TABLET | Freq: Two times a day (BID) | ORAL | Status: DC

## 2014-02-10 NOTE — Assessment & Plan Note (Addendum)
Hemoglobin A1c slightly more elevated to 7.2 today. No peripheral neuropathy symptoms. Evaluates feet on a regular basis for injuries. - After discussion with patient, will continue with metformin 500mg  BID. May consider increase at next appointment.  - Discussed life style changes - Patient due for an eye exam and states she'll make an appointment. - Repeat A1c in 3 months

## 2014-02-10 NOTE — Progress Notes (Signed)
Patient ID: Mariah BreslowMadeline Shorkey, female   DOB: 10-02-1940, 73 y.o.   MRN: [id number] [facility]  Joanna Puffrystal S. Dorsey, MD  Subjective:  Chief complaint: Meet new MD  Patient is a 73 y/o female with a PMH of anxiety, Type 2 DM, and HTN.  Anxiety: Patient currently still taking Effexor 37.5mg  BID. States that she doesn't like it because it makes her sweat profusely. She would like to change and states Xanax worked well but her last MD did not want to prescribe it. Her anxiety is not generalized, its mostly centered around being in enclosed spaces (elevators, etc) and in crowds.  GAD score 3  Type 2 DM: Currently on metformin 500mg  BID. Denies any side effects. No hypoglycemia. No peripheral neuropathy.  HTN: Notes her BP has been in the 140/80s at home, but that it has been high at each of her appointments. Denies any dizziness, SOB, change in vision, or chest pain.   Health maintenance: patient's last colonoscopy was in 2007. Patient declines a influenza vaccine today.  ROS- No dysuria, constipation, diarrhea, headaches  Past Medical History Patient Active Problem List   Diagnosis Date Noted  . Otalgia of right ear 01/26/2014  . Essential hypertension, benign 06/02/2013  . Type II or unspecified type diabetes mellitus without mention of complication, not stated as uncontrolled 06/02/2013  . Anxiety state, unspecified 06/02/2013    Medications- reviewed and updated Current Outpatient Prescriptions  Medication Sig Dispense Refill  . benazepril (LOTENSIN) 20 MG tablet Take 1 tablet (20 mg total) by mouth daily.  30 tablet  3  . chlorthalidone (HYGROTON) 25 MG tablet Take 1 tablet (25 mg total) by mouth daily.  30 tablet  3  . metFORMIN (GLUCOPHAGE) 500 MG tablet Take 1 tablet (500 mg total) by mouth 2 (two) times daily with a meal.  60 tablet  3  . sertraline (ZOLOFT) 25 MG tablet Take 1 tablet (25 mg total) by mouth daily.  30 tablet  1  . venlafaxine (EFFEXOR) 37.5 MG  tablet Take 1 tablet (37.5 mg total) by mouth 2 (two) times daily.  60 tablet  0   No current facility-administered medications for this visit.    Objective: BP 170/88  Pulse 81  Temp(Src) 98.6 F (37 C) (Oral)  Ht 5\' 3"  (1.6 m)  Wt 197 lb (89.359 kg)  BMI 34.91 kg/m2 Gen: No acute distress. Alert, cooperative with exam HEENT: Atraumatic, EOMI, PERRLA, Oropharynx clear. MMM CV: RRR. No murmurs, rubs, or gallops noted. 2+ radial and DP pulses bilaterally. Resp: CTAB. No wheezing, crackles, or rhonchi noted. Abd: +BS. Soft, non-distended, non-tender. No rebound or guarding.  Ext: No edema. No gross deformities. Monofilament test within normal limits.  Neuro: Alert and oriented, No gross focal deficits   Assessment/Plan:  Essential hypertension, benign Repeat manual BP by MD was 166/84. Patient denies any symptoms of hypertension or any spells of orthostatic hypotension.  Patient recently switched from lisinopril to benazepril due insomnia.  - Increase benazepril to 20mg  daily - Given increase in dosage, will repeat CMET in 2 weeks - Continue chlorthalidone   Type II or unspecified type diabetes mellitus without mention of complication, not stated as uncontrolled Hemoglobin A1c slightly more elevated to 7.2 today. No peripheral neuropathy symptoms. Evaluates feet on a regular basis for injuries. - After discussion with patient, will continue with metformin 500mg  BID. May consider increase at next appointment.  - Discussed life style changes - Patient due for an eye exam and states  she'll make an appointment. - Repeat A1c in 3 months   Anxiety state, unspecified Patient with situational anxiety with a GAD score of 3. It does not seem to interfere with her daily life. I do not feel Xanax would be a good option give her age. - Will taper off Effexor: 37.5mg  once daily x 1 week then discontinue completely.  - Start Zoloft 25mg  daily - Follow up in 2 months to assess  response     Orders Placed This Encounter  Procedures  . Comprehensive metabolic panel    Standing Status: Future     Number of Occurrences:      Standing Expiration Date: 02/11/2015  . POCT glycosylated hemoglobin (Hb A1C)    Meds ordered this encounter  Medications  . benazepril (LOTENSIN) 20 MG tablet    Sig: Take 1 tablet (20 mg total) by mouth daily.    Dispense:  30 tablet    Refill:  3  . venlafaxine (EFFEXOR) 37.5 MG tablet    Sig: Take 1 tablet (37.5 mg total) by mouth 2 (two) times daily.    Dispense:  60 tablet    Refill:  0    Take 37.5mg  BID x 1 week, then decrease to 37.5mg  once daily x 1 week, then discontinue completely.  Marland Kitchen DISCONTD: sertraline (ZOLOFT) 50 MG tablet    Sig: Take 1 tablet (50 mg total) by mouth daily.    Dispense:  30 tablet    Refill:  1  . sertraline (ZOLOFT) 25 MG tablet    Sig: Take 1 tablet (25 mg total) by mouth daily.    Dispense:  30 tablet    Refill:  1

## 2014-02-10 NOTE — Assessment & Plan Note (Addendum)
Patient with situational anxiety with a GAD score of 3. It does not seem to interfere with her daily life. I do not feel Xanax would be a good option give her age. - Will taper off Effexor: 37.5mg  once daily x 1 week then discontinue completely.  - Start Zoloft 25mg  daily - Follow up in 2 months to assess response

## 2014-02-10 NOTE — Patient Instructions (Addendum)
For the Effexor, take 37.5mg  twice daily for 1 week, then decrease to 37.5mg  once daily for 1 week, then discontinue completely. You can start taking the Zoloft tomorrow. We increased the benazepril 20mg  daily (from 10mg  daily). Please make a lab appointment for 2 weeks from now. Please follow up with me in 2 months to see how the Zoloft is helping.

## 2014-02-10 NOTE — Assessment & Plan Note (Signed)
Repeat manual BP by MD was 166/84. Patient denies any symptoms of hypertension or any spells of orthostatic hypotension.  Patient recently switched from lisinopril to benazepril due insomnia.  - Increase benazepril to 20mg  daily - Given increase in dosage, will repeat CMET in 2 weeks - Continue chlorthalidone

## 2014-04-19 ENCOUNTER — Encounter: Payer: Self-pay | Admitting: Family Medicine

## 2014-04-19 ENCOUNTER — Ambulatory Visit (INDEPENDENT_AMBULATORY_CARE_PROVIDER_SITE_OTHER): Payer: Medicare Other | Admitting: Family Medicine

## 2014-04-19 VITALS — BP 160/90 | HR 69 | Temp 97.8°F | Ht 63.0 in | Wt 200.0 lb

## 2014-04-19 DIAGNOSIS — G4700 Insomnia, unspecified: Secondary | ICD-10-CM | POA: Diagnosis not present

## 2014-04-19 DIAGNOSIS — F411 Generalized anxiety disorder: Secondary | ICD-10-CM | POA: Diagnosis not present

## 2014-04-19 DIAGNOSIS — I10 Essential (primary) hypertension: Secondary | ICD-10-CM | POA: Diagnosis not present

## 2014-04-19 LAB — COMPREHENSIVE METABOLIC PANEL
ALK PHOS: 69 U/L (ref 39–117)
ALT: 14 U/L (ref 0–35)
AST: 13 U/L (ref 0–37)
Albumin: 4.4 g/dL (ref 3.5–5.2)
BILIRUBIN TOTAL: 0.3 mg/dL (ref 0.2–1.2)
BUN: 13 mg/dL (ref 6–23)
CO2: 30 mEq/L (ref 19–32)
Calcium: 9.7 mg/dL (ref 8.4–10.5)
Chloride: 100 mEq/L (ref 96–112)
Creat: 0.8 mg/dL (ref 0.50–1.10)
Glucose, Bld: 110 mg/dL — ABNORMAL HIGH (ref 70–99)
Potassium: 4.2 mEq/L (ref 3.5–5.3)
SODIUM: 137 meq/L (ref 135–145)
TOTAL PROTEIN: 7.3 g/dL (ref 6.0–8.3)

## 2014-04-19 MED ORDER — LOSARTAN POTASSIUM 50 MG PO TABS: 50.0000 mg | ORAL_TABLET | Freq: Every day | ORAL | Status: DC

## 2014-04-19 MED ORDER — TRAZODONE HCL 50 MG PO TABS: 25.0000 mg | ORAL_TABLET | Freq: Every evening | ORAL | Status: DC | PRN

## 2014-04-19 MED ORDER — SERTRALINE HCL 25 MG PO TABS: 25.0000 mg | ORAL_TABLET | Freq: Every day | ORAL | Status: DC

## 2014-04-19 NOTE — Patient Instructions (Addendum)
Please keep track of your left ear ringing Please follow up with me in 1 month  Losartan tablets What is this medicine? LOSARTAN (loe SAR tan) is used to treat high blood pressure and to reduce the risk of stroke in certain patients. This drug also slows the progression of kidney disease in patients with diabetes. This medicine may be used for other purposes; ask your health care provider or pharmacist if you have questions. COMMON BRAND NAME(S): Cozaar What should I tell my health care provider before I take this medicine? They need to know if you have any of these conditions: -heart failure -kidney or liver disease -an unusual or allergic reaction to losartan, other medicines, foods, dyes, or preservatives -pregnant or trying to get pregnant -breast-feeding How should I use this medicine? Take this medicine by mouth with a glass of water. Follow the directions on the prescription label. This medicine can be taken with or without food. Take your doses at regular intervals. Do not take your medicine more often than directed. Talk to your pediatrician regarding the use of this medicine in children. Special care may be needed. Overdosage: If you think you have taken too much of this medicine contact a poison control center or emergency room at once. NOTE: This medicine is only for you. Do not share this medicine with others. What if I miss a dose? If you miss a dose, take it as soon as you can. If it is almost time for your next dose, take only that dose. Do not take double or extra doses. What may interact with this medicine? -blood pressure medicines -diuretics, especially triamterene, spironolactone, or amiloride -fluconazole -NSAIDs, medicines for pain and inflammation, like ibuprofen or naproxen -potassium salts or potassium supplements -rifampin This list may not describe all possible interactions. Give your health care provider a list of all the medicines, herbs, non-prescription  drugs, or dietary supplements you use. Also tell them if you smoke, drink alcohol, or use illegal drugs. Some items may interact with your medicine. What should I watch for while using this medicine? Visit your doctor or health care professional for regular checks on your progress. Check your blood pressure as directed. Ask your doctor or health care professional what your blood pressure should be and when you should contact him or her. Call your doctor or health care professional if you notice an irregular or fast heart beat. Women should inform their doctor if they wish to become pregnant or think they might be pregnant. There is a potential for serious side effects to an unborn child, particularly in the second or third trimester. Talk to your health care professional or pharmacist for more information. You may get drowsy or dizzy. Do not drive, use machinery, or do anything that needs mental alertness until you know how this drug affects you. Do not stand or sit up quickly, especially if you are an older patient. This reduces the risk of dizzy or fainting spells. Alcohol can make you more drowsy and dizzy. Avoid alcoholic drinks. Avoid salt substitutes unless you are told otherwise by your doctor or health care professional. Do not treat yourself for coughs, colds, or pain while you are taking this medicine without asking your doctor or health care professional for advice. Some ingredients may increase your blood pressure. What side effects may I notice from receiving this medicine? Side effects that you should report to your doctor or health care professional as soon as possible: -confusion, dizziness, light headedness or fainting spells -  decreased amount of urine passed -difficulty breathing or swallowing, hoarseness, or tightening of the throat -fast or irregular heart beat, palpitations, or chest pain -skin rash, itching -swelling of your face, lips, tongue, hands, or feet Side effects that  usually do not require medical attention (report to your doctor or health care professional if they continue or are bothersome): -cough -decreased sexual function or desire -headache -nasal congestion or stuffiness -nausea or stomach pain -sore or cramping muscles This list may not describe all possible side effects. Call your doctor for medical advice about side effects. You may report side effects to FDA at 1-800-FDA-1088. Where should I keep my medicine? Keep out of the reach of children. Store at room temperature between 15 and 30 degrees C (59 and 86 degrees F). Protect from light. Keep container tightly closed. Throw away any unused medicine after the expiration date. NOTE: This sheet is a summary. It may not cover all possible information. If you have questions about this medicine, talk to your doctor, pharmacist, or health care provider.  2015, [employer]vier/Gold Standard. (2007-07-11 16:42:18)

## 2014-04-19 NOTE — Assessment & Plan Note (Signed)
I once again shows the importance of not starting benzodiazepines in the elderly population. Patient voices understanding. There is an approximately 1% chance of tinnitus from Zoloft. Unsure if this is the exact cause, as the patient notes that the region occurs immediately after taking medication. -Continue with Zoloft -She will keep a diary of the ringing in her ears in conjunction with what time she takes her medications and the duration of the ringing. -Given patient has difficulties with sleeping that was once improved with alprazolam, will start her on trazodone.

## 2014-04-19 NOTE — Assessment & Plan Note (Signed)
The patient is currently not well controlled, however states that she has not taken her anti-hypertensives today. She denies any symptoms of orthostatic hypotension. She has had numerous side effects to medications, some which cannot be explained. -We'll discontinue benazepril and start losartan 50 mg -Given patient has been on benazepril for 2 months, will check a bmet today -Continue chlorthalidone daily -Continue to monitor

## 2014-04-19 NOTE — Progress Notes (Signed)
Mariah Lindsey  Mariah Lindsey, Mariah Lindsey  Subjective:  Chief complaint: Follow-up for anxiety and hypertension  Mariah Lindsey is a 73 year old with a past history of anxiety, hyperlipidemia, hypertension, intact 2 diabetes presenting for a follow-up.  Anxiety: Patient states that she has tapered completely off Effexor. She has been taking Zoloft, however has not been doing this daily. She notes that she occasionally has left ear tinnitus immediately after taking the medication. She states that the ringing in her ear does not last all day, however she cannot tell me how long it takes. Her anxiety seems to be stable otherwise and seems to be situational in nature.  She is willing to continue with Zoloft for now. She does continue to ask for alprazolam, stating that her anxiety was always well controlled with this, and she has been able to sleep well with this medication in the past.  Hypertension: She is currently maintained on chlorthalidone and benazepril, but states that the benazepril makes her "heart feels funny." She denies any heart palpitations, flutters, pressure, shortness of breath, or chest pain, however states that when she takes it "I know it's there."  The patient has been on numerous antihypertensives in the past. She stated that lisinopril caused insomnia, however notes that the lisinopril was started at around the time that her alprazolam was discontinued. She notes that her insomnia could be from the discontinuation alprazolam, however prefers not to try lisinopril again. She did not like hydrochlorothiazide (when she took it in a valsartan, hydrochlorothiazide combination) disease as it caused alopecia.   Past Medical History Patient Active Problem List   Diagnosis Date Noted  . Insomnia 04/19/2014  . Otalgia of right ear 01/26/2014  . Essential hypertension, benign 06/02/2013  . Type II or unspecified type diabetes mellitus without mention of complication, not stated as  uncontrolled 06/02/2013  . Anxiety state 06/02/2013    Medications- reviewed and updated Current Outpatient Prescriptions  Medication Sig Dispense Refill  . chlorthalidone (HYGROTON) 25 MG tablet Take 1 tablet (25 mg total) by mouth daily. 30 tablet 3  . metFORMIN (GLUCOPHAGE) 500 MG tablet Take 1 tablet (500 mg total) by mouth 2 (two) times daily with a meal. 60 tablet 3  . losartan (COZAAR) 50 MG tablet Take 1 tablet (50 mg total) by mouth daily. 90 tablet 3  . sertraline (ZOLOFT) 25 MG tablet Take 1 tablet (25 mg total) by mouth daily. 30 tablet 1  . traZODone (DESYREL) 50 MG tablet Take 0.5-1 tablets (25-50 mg total) by mouth at bedtime as needed for sleep. 30 tablet 3   No current facility-administered medications for this visit.    Objective: BP 160/90 mmHg  Pulse 69  Temp(Src) 97.8 F (36.6 C) (Oral)  Ht 5\' 3"  (1.6 m)  Wt 200 lb (90.719 kg)  BMI 35.44 kg/m2 Gen: No acute distress. Alert, cooperative with exam HEENT: Atraumatic, EOMI, PERRLA, Oropharynx clear. MMM CV: RRR. No murmurs, rubs, or gallops noted. 2+ radial pulses bilaterally. Resp: CTAB. No wheezing, crackles, or rhonchi noted. Abd: +BS. Soft, non-distended, non-tender. No rebound or guarding.  Ext: No edema. No gross deformities. Neuro: Alert and oriented, No gross focal deficits   Assessment/Plan:  Essential hypertension, benign The patient is currently not well controlled, however states that she has not taken her anti-hypertensives today. She denies any symptoms of orthostatic hypotension. She has had numerous side effects to medications, some which cannot be explained. -We'll discontinue benazepril and start losartan 50 mg -Given patient has  been on benazepril for 2 months, will check a bmet today -Continue chlorthalidone daily -Continue to monitor  Anxiety state I once again shows the importance of not starting benzodiazepines in the elderly population. Patient voices understanding. There is an  approximately 1% chance of tinnitus from Zoloft. Unsure if this is the exact cause, as the patient notes that the region occurs immediately after taking medication. -Continue with Zoloft -She will keep a diary of the ringing in her ears in conjunction with what time she takes her medications and the duration of the ringing. -Given patient has difficulties with sleeping that was once improved with alprazolam, will start her on trazodone.    Orders Placed This Encounter  Procedures  . Comprehensive metabolic panel    Meds ordered this encounter  Medications  . losartan (COZAAR) 50 MG tablet    Sig: Take 1 tablet (50 mg total) by mouth daily.    Dispense:  90 tablet    Refill:  3  . traZODone (DESYREL) 50 MG tablet    Sig: Take 0.5-1 tablets (25-50 mg total) by mouth at bedtime as needed for sleep.    Dispense:  30 tablet    Refill:  3  . sertraline (ZOLOFT) 25 MG tablet    Sig: Take 1 tablet (25 mg total) by mouth daily.    Dispense:  30 tablet    Refill:  1

## 2014-04-20 ENCOUNTER — Telehealth: Payer: Self-pay | Admitting: Family Medicine

## 2014-04-20 DIAGNOSIS — F411 Generalized anxiety disorder: Secondary | ICD-10-CM

## 2014-04-20 NOTE — Telephone Encounter (Signed)
Was about clinic yesterday. Pt wants to know if she is to contiue to take. Please advise

## 2014-04-21 MED ORDER — SERTRALINE HCL 25 MG PO TABS: 25.0000 mg | ORAL_TABLET | Freq: Every day | ORAL | Status: DC

## 2014-04-21 NOTE — Telephone Encounter (Signed)
Spoke with Mariah Lindsey and clarified her medications.  She should be taking trazodone (new) at bedtime PRN insomnia  She should continue Zoloft and keep a diary of symptoms (ringing in her ears) She should STOP benazepril (which she's already done) She should START Losartan  She should continue chlorthalidone.  Patient voiced understanding   Joanna Puffrystal S. Dorsey, MD [facility] Resident  04/21/2014, 4:35 PM

## 2014-04-21 NOTE — Telephone Encounter (Signed)
Patient wanting to make sure that it will be okay to take the trazadone with her other medications. Will forward to PCP.

## 2014-05-31 ENCOUNTER — Encounter: Payer: Self-pay | Admitting: Family Medicine

## 2014-05-31 ENCOUNTER — Ambulatory Visit (INDEPENDENT_AMBULATORY_CARE_PROVIDER_SITE_OTHER): Payer: Medicare Other | Admitting: Family Medicine

## 2014-05-31 VITALS — BP 179/82 | HR 74 | Temp 97.7°F | Ht 63.0 in | Wt 199.7 lb

## 2014-05-31 DIAGNOSIS — E119 Type 2 diabetes mellitus without complications: Secondary | ICD-10-CM

## 2014-05-31 DIAGNOSIS — I10 Essential (primary) hypertension: Secondary | ICD-10-CM

## 2014-05-31 MED ORDER — LOSARTAN POTASSIUM 100 MG PO TABS: 100.0000 mg | ORAL_TABLET | Freq: Every day | ORAL | Status: DC

## 2014-05-31 NOTE — Patient Instructions (Signed)
It was nice to see you again. Please continue to take metformin, just half the regular dose that you've been doing. Please keep a blood pressure log. Please follow up with me in 3 months (and take your blood pressure medicine prior to coming).

## 2014-05-31 NOTE — Progress Notes (Signed)
Subjective: CC:HTN f/u; concerns about metformin HPI: Patient is a 74 y.o. female presenting to clinic today for HTN follow up and concerns about metformin. Concerns today include:  Hypertension Blood pressure at home:  Was previously 140/60 prior to stopping benazepril and starting losartan (patient endorsed side effects with benazepril). Her blood current blood pressures have been 150/60 Blood pressure today: 179/82 >168/82 Taking Meds: Currently taking losartan 50 mg and chlorthalidone 25 mg; however notes she didn't take her medications this AM. Side effects: None to these medications ROS: Denies headache, dizziness, visual changes, nausea, vomiting, chest pain, abdominal pain or shortness of breath.  Diabetes:  CBGs at home: Not taking Taking medications: Self- discontinued metformin 500 mg 3 times a day for approximately 3 days Side effects: Patient states that she felt like her ears were full and she was "off balance" secondary to metformin. She's been on metformin for quite some time now, and states that she just now began to have the symptoms. When she discontinued the metformin, she noticed immediate improvement in her balance and her ear complaints. She denies any true dizziness. Denies any issues with going from a sitting to a standing position. ROS: denies fever, chills, dizziness, LOC, polyuria, polydipsia, numbness or tingling in extremities or chest pain. Last eye exam: Patient is due for an eye exam, reiterated the importance of an intraretinal screen Last foot exam: 02-10-2014 Last flu, zoster and/or pneumovax: Declined vaccines today  Social History: smoking status reviewed.  ROS: All other systems reviewed and are negative.  Past Medical History Patient Active Problem List   Diagnosis Date Noted  . Insomnia 04/19/2014  . Otalgia of right ear 01/26/2014  . Essential hypertension, benign 06/02/2013  . Diabetes 06/02/2013  . Anxiety state 06/02/2013     Medications- reviewed and updated Current Outpatient Prescriptions  Medication Sig Dispense Refill  . chlorthalidone (HYGROTON) 25 MG tablet Take 1 tablet (25 mg total) by mouth daily. 30 tablet 3  . sertraline (ZOLOFT) 25 MG tablet Take 1 tablet (25 mg total) by mouth daily. 30 tablet 1  . losartan (COZAAR) 100 MG tablet Take 1 tablet (100 mg total) by mouth daily. 90 tablet 3  . metFORMIN (GLUCOPHAGE) 500 MG tablet Take 1 tablet (500 mg total) by mouth 2 (two) times daily with a meal. (Patient not taking: Reported on 05/31/2014) 60 tablet 3  . traZODone (DESYREL) 50 MG tablet Take 0.5-1 tablets (25-50 mg total) by mouth at bedtime as needed for sleep. (Patient not taking: Reported on 05/31/2014) 30 tablet 3   No current facility-administered medications for this visit.    Objective: Office vital signs reviewed. BP 179/82 mmHg  Pulse 74  Temp(Src) 97.7 F (36.5 C) (Oral)  Ht _0  (1.6 m)  Wt 199 lb 11.2 oz (90.583 kg)  BMI 35.38 kg/m2   Physical Examination:  General: Awake, alert, well- nourished, NAD HEENT: Normal    Neck: No masses palpated. No LAD    Ears: TMs intact, normal light reflex, no erythema, no bulging    Eyes: PERRLA, EOMI    Nose: nasal turbinates moist; some crusting in the nares    Throat: MMM, no erythema Cardio: RRR, no murmurs, rubs, or gallops appreciated Pulm: CTAB, no wheezes, rhonchi or crackles Extremities: 2+ pulses bilaterally; no peripheral edema  MSK: Normal gait and station Skin: dry, intact, no rashes or lesions  Assessment/Plan: Essential hypertension, benign The patient's blood pressure is currently not at goal, on repeat exam it was slightly  improved 168/82, however a goal for her is less than 140/90. Patient has not taken her medications this morning, however I feel that this does not have a significant effect. -Refill chlorthalidone -We'll increase losartan from 50 mg to 100 mg daily -The previous BMET was within normal limits, will  recheck a he met in approximately 3 months -Follow-up in 3 months, patient advised to take her medications prior to arrival    Diabetes Patient's last A1c was 7.2. She endorses a sensation of being off balance and "her ears feeling full" which seems to have improved since she self discontinued her metformin. From reading the literature, there is a 1-5% chance of dizziness with metformin, however the patient denies any frank dizziness -Discussed the possibility of taking metformin 500 mg daily instead of twice daily (stated she could take this at bedtime) -Patient to return to clinic if symptoms return with 500 mg daily. -Could ultimately consider a low-dose sulfonylurea in the future if metformin becomes problematic     No orders of the defined types were placed in this encounter.    Meds ordered this encounter  Medications  . losartan (COZAAR) 100 MG tablet    Sig: Take 1 tablet (100 mg total) by mouth daily.    Dispense:  90 tablet    Refill:  3    Archie Patten, MD PGY-1, [place]

## 2014-05-31 NOTE — Assessment & Plan Note (Signed)
Patient's last A1c was 7.2. She endorses a sensation of being off balance and "her ears feeling full" which seems to have improved since she self discontinued her metformin. From reading the literature, there is a 1-5% chance of dizziness with metformin, however the patient denies any frank dizziness -Discussed the possibility of taking metformin 500 mg daily instead of twice daily (stated she could take this at bedtime) -Patient to return to clinic if symptoms return with 500 mg daily. -Could ultimately consider a low-dose sulfonylurea in the future if metformin becomes problematic

## 2014-05-31 NOTE — Assessment & Plan Note (Signed)
The patient's blood pressure is currently not at goal, on repeat exam it was slightly improved 168/82, however a goal for her is less than 140/90. Patient has not taken her medications this morning, however I feel that this does not have a significant effect. -Refill chlorthalidone -We'll increase losartan from 50 mg to 100 mg daily -The previous BMET was within normal limits, will recheck a he met in approximately 3 months -Follow-up in 3 months, patient advised to take her medications prior to arrival

## 2014-07-21 ENCOUNTER — Other Ambulatory Visit: Payer: Self-pay | Admitting: Family Medicine

## 2014-07-21 DIAGNOSIS — F411 Generalized anxiety disorder: Secondary | ICD-10-CM

## 2014-07-21 MED ORDER — SERTRALINE HCL 25 MG PO TABS: 25.0000 mg | ORAL_TABLET | Freq: Every day | ORAL | Status: AC

## 2014-07-21 NOTE — Telephone Encounter (Signed)
Needs zoloft refilled [facility] [address]

## 2014-07-21 NOTE — Telephone Encounter (Signed)
Please let the patient know I refilled her Rx and ask her to make an appt with me in the next few weeks so we can see how her blood pressure is doing and check labs.  Thanks, Joanna Puffrystal S. Soliana Kitko, MD [facility] Res[facility]  07/21/2014, 1:49 PM

## 2014-08-07 ENCOUNTER — Ambulatory Visit (INDEPENDENT_AMBULATORY_CARE_PROVIDER_SITE_OTHER): Payer: Medicare Other | Admitting: Internal Medicine

## 2014-08-07 VITALS — BP 159/83 | HR 72 | Temp 98.0°F | Resp 18 | Ht 64.5 in | Wt 196.0 lb

## 2014-08-07 DIAGNOSIS — L723 Sebaceous cyst: Secondary | ICD-10-CM

## 2014-08-07 MED ORDER — CEPHALEXIN 500 MG PO CAPS: 500.0000 mg | ORAL_CAPSULE | Freq: Three times a day (TID) | ORAL | Status: DC

## 2014-08-07 NOTE — Progress Notes (Addendum)
I&D of Infected Sebaceous Cyst: Verbal consent obtained. Local anesthesia with 2.5cc of 2% lidocaine with epi. Site cleansed with Betadine.  Incision of 1.5cm was made using a 11 blade, discharge of moderate amount of pus and serosanguinous fluid, sebaceous cyst taken out in 2 parts, largest part ~1.5cm in diameter. Wound cavity was explored with curved hemostats and aggressively packed with 1/2" plain packing. Cleansed and dressed. After care instructions provided. Patient to return to clinic on 08/09/2014 for reevaluation/repacking.  Wallis BambergMario Tennyson Kallen, PA-C [facility] [phone] 08/07/2014  2:37 PM  I have participated in the care of this patient with the Advanced Practice Provider and agree with Diagnosis and Plan as documented. Robert P. Merla Richesoolittle, M.D.

## 2014-08-07 NOTE — Patient Instructions (Addendum)
-   Please keep area clean and dry. - Come back in 2 days to re-evaluate and repack your wound.   Cyst Removal Your caregiver has removed a cyst. A cyst is a sac containing a semi-solid material. Cysts may occur any place on your body. They may remain small for years or gradually get larger. A sebaceous cyst is an enlarged (dilated) sweat gland filled with old sweat (sebum). Unattended, these may become large (the size of a softball) over several years time. These are often removed for improved appearance (cosmetic) reasons or before they become infected to form an abscess. An abscess is an infected cyst. HOME CARE INSTRUCTIONS   Keep your bandage clean and dry. You may change your bandage after 24 hours. If your bandage sticks, use warm water to gently loosen it. Pat the area dry with a clean towel before putting on another bandage.  If possible, keep the area where the cyst was removed raised to relieve soreness, swelling, and promote healing.  If you have stitches, keep them clean and dry.  You may clean your stitches gently with a cotton swab dipped in warm soapy water.  Do not soak the area where the cyst was removed or go swimming. You may shower.  Do not overuse the area where your cyst was removed.  Return in 7 days or as directed to have your stitches removed.  Take medicines as instructed by your caregiver. SEEK IMMEDIATE MEDICAL CARE IF:   An oral temperature above 102 F (38.9 C) develops, not controlled by medication.  Blood continues to soak through the bandage.  You have increasing pain in the area where your cyst was removed.  You have redness, swelling, pus, a bad smell, soreness (inflammation), or red streaks coming away from the stitches. These are signs of infection. MAKE SURE YOU:   Understand these instructions.  Will watch your condition.  Will get help right away if you are not doing well or get worse. Document Released: 04/27/2000 Document Revised:  07/23/2011 Document Reviewed: 08/21/2007 [facility] Patient Information 2015 [place]. This information is not intended to replace advice given to you by your health care provider. Make sure you discuss any questions you have with your health care provider.

## 2014-08-07 NOTE — Addendum Note (Signed)
Addended by: Tonye PearsonOLITTLE, Elanore Talcott P on: 08/07/2014 04:04 PM   Modules accepted: Orders

## 2014-08-07 NOTE — Progress Notes (Addendum)
Subjective:   This chart was scribed for Ellamae Siaobert Tilly Pernice, MD by Jarvis Morganaylor Ferguson, Medical Scribe. This patient was seen in Room 10 and the patient's care was started at 1:51 PM.   Patient ID: Mariah Lindsey, female    DOB: 05-Jul-1940, 74 y.o.   MRN: [id number]  Chief Complaint  Patient presents with  . Cyst    lt arm x1 mth     HPI HPI Comments: Mariah Lindsey is a 74 y.o. female who [facility] complaining of gradually worsening cyst to her left axilla for 1 month. She has some associated redness to the area. Pt states she has had one there before, but it was about 2 years ago. She is prediabetic(chart indicates diabetes treated with metformin at one point). She denies any fever or chills.    Patient Active Problem List   Diagnosis Date Noted  . Insomnia 04/19/2014  . Otalgia of right ear 01/26/2014  . Essential hypertension, benign 06/02/2013  . Diabetes 06/02/2013  . Anxiety state 06/02/2013   Past Medical History  Diagnosis Date  . Diabetes mellitus   . Hypertension   . Anxiety   . Allergy   . Arthritis   . Depression    Past Surgical History  Procedure Laterality Date  . Cholecystectomy  10/2004  . Abdominal hysterectomy  1972    ovaries remain, hysterectomy done for "precancerous cells", never diagnosed with malignancy   Allergies  Allergen Reactions  . Benadryl [Diphenhydramine Hcl]     Unknown if all Benedryl products   Prior to Admission medications   Medication Sig Start Date End Date Taking? Authorizing Provider  losartan (COZAAR) 100 MG tablet Take 1 tablet (100 mg total) by mouth daily. 05/31/14  Yes Joanna Puffrystal S Dorsey, MD  metFORMIN (GLUCOPHAGE) 500 MG tablet Take 1 tablet (500 mg total) by mouth 2 (two) times daily with a meal. 06/02/13  [facility] [place]  Yes Lonia SkinnerStephanie E Losq, MD  sertraline (ZOLOFT) 25 MG tablet Take 1 tablet (25 mg total) by mouth daily. 07/21/14  Yes Joanna Puffrystal S Dorsey, MD    chlorthalidone (HYGROTON) 25 MG tablet Take 1 tablet (25 mg total) by mouth daily. Patient not taking: Reported on 08/07/2014 10/09/13   Lonia SkinnerStephanie E Losq, MD  traZODone (DESYREL) 50 MG tablet Take 0.5-1 tablets (25-50 mg total) by mouth at bedtime as needed for sleep. Patient not taking: Reported on 05/31/2014 04/19/14   Joanna Puffrystal S Dorsey, MD   History   Social History  . Marital Status: Widowed    Spouse Name: N/A  . Number of Children: N/A  . Years of Education: 12   Occupational History  . Not on file.   Social History Main Topics  . Smoking status: Never Smoker   . Smokeless tobacco: Never Used  . Alcohol Use: No  . Drug Use: No  . Sexual Activity: No   Other Topics Concern  . 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    She lives with her son and daughter    Review of Systems All systems are negative except as noted in the HPI and PMH.      Objective:   Physical Exam  Constitutional: She is oriented to person, place, and time. She appears well-developed and well-nourished. No distress.  HENT:  Head: Normocephalic and atraumatic.  Eyes: Conjunctivae and EOM are normal.  Neck: Neck supple.  Cardiovascular: Normal rate.   Pulmonary/Chest: Effort normal. No respiratory distress.  Musculoskeletal: Normal range of motion.  Neurological: She is alert and oriented to person, place, and time.  Skin: Skin is warm and dry.  Posterior axillary line has a 3 cm x 3 cm fluctuant erythematous mass with mild tenderness.   Psychiatric: She has a normal mood and affect. Her behavior is normal.  Nursing note and vitals reviewed.     Filed Vitals:   08/07/14 1348  BP: 159/83  Pulse: 72  Temp: 98 F (36.7 C)  TempSrc: Oral  Resp: 18  Height: 5' 4.5" (1.638 m)  Weight: 196 lb (88.905 kg)  SpO2: 97%       Assessment & Plan:   Infected sebaceous cyst  See surgery note Follow-up for removal of packing Cult pending Cover with Keflex due to purulence  I have completed the patient encounter in its entirety as documented by the scribe, with editing by me where necessary. Tabithia Stroder P. Merla Riches, M.D.

## 2014-08-09 ENCOUNTER — Ambulatory Visit: Payer: Self-pay | Admitting: Family Medicine

## 2014-08-10 ENCOUNTER — Ambulatory Visit (INDEPENDENT_AMBULATORY_CARE_PROVIDER_SITE_OTHER): Payer: Medicare Other | Admitting: Emergency Medicine

## 2014-08-10 VITALS — BP 110/74 | HR 99 | Temp 97.9°F | Resp 18 | Ht 64.5 in | Wt 197.0 lb

## 2014-08-10 DIAGNOSIS — L723 Sebaceous cyst: Secondary | ICD-10-CM

## 2014-08-10 LAB — WOUND CULTURE
Gram Stain: NONE SEEN
Gram Stain: NONE SEEN
Organism ID, Bacteria: NO GROWTH

## 2014-08-10 NOTE — Progress Notes (Signed)
[facility] [address], [place] [address] [phone]  Date:  08/10/2014   Name:  Mariah Lindsey   DOB:  1941-04-12   MRN:  [id number]  PCP:  Joanna Puff, MD    Chief Complaint: Wound Check   History of Present Illness:  Mariah Lindsey is a 74 y.o. very pleasant female patient who presents with the following:  Wound recheck following I&D No adverse effect No fever or chills.   Patient Active Problem List   Diagnosis Date Noted  . Insomnia 04/19/2014  . Otalgia of right ear 01/26/2014  . Essential hypertension, benign 06/02/2013  . Diabetes 06/02/2013  . Anxiety state 06/02/2013    Past Medical History  Diagnosis Date  . Diabetes mellitus   . Hypertension   . Anxiety   . Allergy   . Arthritis   . Depression     Past Surgical History  Procedure Laterality Date  . Cholecystectomy  10/2004  . Abdominal hysterectomy  1972    ovaries remain, hysterectomy done for "precancerous cells", never diagnosed with malignancy    History  Substance Use Topics  . Smoking status: Never Smoker   . Smokeless tobacco: Never Used  . Alcohol Use: No    Family History  Problem Relation Age of Onset  . Asthma Other   . Cancer Other     other brother with leukemia, died at age 25yo  . Cancer Mother     metastatic, unclear source  . Hypertension Mother   . Cancer Sister     lung cancer  . Cancer Brother     prostate cancer    Allergies  Allergen Reactions  . Benadryl [Diphenhydramine Hcl]     Unknown if all Benedryl products    Medication list has been reviewed and updated.  Current Outpatient Prescriptions on File Prior to Visit  Medication Sig Dispense Refill  . cephALEXin (KEFLEX) 500 MG capsule Take 1 capsule (500 mg total) by mouth 3 (three) times daily. 21 capsule 0  . losartan (COZAAR) 100 MG tablet Take 1 tablet (100 mg total) by mouth daily. 90 tablet 3  . metFORMIN (GLUCOPHAGE) 500 MG tablet Take 1 tablet (500 mg total)  by mouth 2 (two) times daily with a meal. 60 tablet 3  . sertraline (ZOLOFT) 25 MG tablet Take 1 tablet (25 mg total) by mouth daily. 30 tablet 3  . chlorthalidone (HYGROTON) 25 MG tablet Take 1 tablet (25 mg total) by mouth daily. (Patient not taking: Reported on 08/07/2014) 30 tablet 3  . traZODone (DESYREL) 50 MG tablet Take 0.5-1 tablets (25-50 mg total) by mouth at bedtime as needed for sleep. (Patient not taking: Reported on 05/31/2014) 30 tablet 3   No current facility-administered medications on file prior to visit.    Review of Systems:  As per HPI, otherwise negative.    Physical Examination: Filed Vitals:   08/10/14 1102  BP: 110/74  Pulse: 99  Temp: 97.9 F (36.6 C)  Resp: 18   Filed Vitals:   08/10/14 1102  Height: 5' 4.5" (1.638 m)  Weight: 197 lb (89.359 kg)   Body mass index is 33.31 kg/(m^2). Ideal Body Weight: Weight in (lb) to have BMI = 25: 147.6   GEN: WDWN, NAD, Non-toxic, Alert & Oriented x 3 HEENT: Atraumatic, Normocephalic.  Ears and Nose: No external deformity. EXTR: No clubbing/cyanosis/edema NEURO: Normal gait.  PSYCH: Normally interactive. Conversant. Not depressed or anxious appearing.  Calm demeanor.  No cellulitis  scant drainage   Assessment and Plan: Packing removed Follow up prn  Signed,  Phillips OdorJeffery Anderson, MD

## 2014-08-13 ENCOUNTER — Telehealth: Payer: Self-pay | Admitting: Family Medicine

## 2014-08-13 NOTE — Telephone Encounter (Signed)
Ms. Mariah Lindsey is requesting the last 2 A1C results she received mailed to her.

## 2014-08-13 NOTE — Telephone Encounter (Signed)
A1C results mailed per patient request.

## 2014-09-27 DIAGNOSIS — F419 Anxiety disorder, unspecified: Secondary | ICD-10-CM | POA: Diagnosis not present

## 2014-09-27 DIAGNOSIS — E119 Type 2 diabetes mellitus without complications: Secondary | ICD-10-CM | POA: Diagnosis not present

## 2014-09-27 DIAGNOSIS — G4700 Insomnia, unspecified: Secondary | ICD-10-CM | POA: Diagnosis not present

## 2014-09-27 DIAGNOSIS — M545 Low back pain: Secondary | ICD-10-CM | POA: Diagnosis not present

## 2014-09-27 DIAGNOSIS — E559 Vitamin D deficiency, unspecified: Secondary | ICD-10-CM | POA: Diagnosis not present

## 2014-09-27 DIAGNOSIS — I10 Essential (primary) hypertension: Secondary | ICD-10-CM | POA: Diagnosis not present

## 2014-09-27 DIAGNOSIS — E785 Hyperlipidemia, unspecified: Secondary | ICD-10-CM | POA: Diagnosis not present

## 2014-09-27 DIAGNOSIS — Z7189 Other specified counseling: Secondary | ICD-10-CM | POA: Diagnosis not present

## 2014-10-19 DIAGNOSIS — H2513 Age-related nuclear cataract, bilateral: Secondary | ICD-10-CM | POA: Diagnosis not present

## 2014-10-19 DIAGNOSIS — H18413 Arcus senilis, bilateral: Secondary | ICD-10-CM | POA: Diagnosis not present

## 2014-10-19 DIAGNOSIS — H43811 Vitreous degeneration, right eye: Secondary | ICD-10-CM | POA: Diagnosis not present

## 2014-10-19 DIAGNOSIS — H25013 Cortical age-related cataract, bilateral: Secondary | ICD-10-CM | POA: Diagnosis not present

## 2014-10-19 DIAGNOSIS — E119 Type 2 diabetes mellitus without complications: Secondary | ICD-10-CM | POA: Diagnosis not present

## 2014-12-07 DIAGNOSIS — Z1231 Encounter for screening mammogram for malignant neoplasm of breast: Secondary | ICD-10-CM | POA: Diagnosis not present

## 2014-12-16 DIAGNOSIS — E1165 Type 2 diabetes mellitus with hyperglycemia: Secondary | ICD-10-CM | POA: Diagnosis not present

## 2014-12-16 DIAGNOSIS — Z0000 Encounter for general adult medical examination without abnormal findings: Secondary | ICD-10-CM | POA: Diagnosis not present

## 2014-12-16 DIAGNOSIS — R600 Localized edema: Secondary | ICD-10-CM | POA: Diagnosis not present

## 2014-12-16 DIAGNOSIS — F411 Generalized anxiety disorder: Secondary | ICD-10-CM | POA: Diagnosis not present

## 2014-12-16 DIAGNOSIS — E785 Hyperlipidemia, unspecified: Secondary | ICD-10-CM | POA: Diagnosis not present

## 2014-12-16 DIAGNOSIS — R55 Syncope and collapse: Secondary | ICD-10-CM | POA: Diagnosis not present

## 2014-12-16 DIAGNOSIS — Z1389 Encounter for screening for other disorder: Secondary | ICD-10-CM | POA: Diagnosis not present

## 2014-12-16 DIAGNOSIS — I10 Essential (primary) hypertension: Secondary | ICD-10-CM | POA: Diagnosis not present

## 2014-12-16 DIAGNOSIS — E559 Vitamin D deficiency, unspecified: Secondary | ICD-10-CM | POA: Diagnosis not present

## 2015-02-01 DIAGNOSIS — R600 Localized edema: Secondary | ICD-10-CM | POA: Diagnosis not present

## 2015-02-01 DIAGNOSIS — I10 Essential (primary) hypertension: Secondary | ICD-10-CM | POA: Diagnosis not present

## 2015-02-21 NOTE — Progress Notes (Signed)
Cancelling order as lab not collected, no need to re-order 

## 2015-03-30 DIAGNOSIS — E785 Hyperlipidemia, unspecified: Secondary | ICD-10-CM | POA: Diagnosis not present

## 2015-03-30 DIAGNOSIS — E1165 Type 2 diabetes mellitus with hyperglycemia: Secondary | ICD-10-CM | POA: Diagnosis not present

## 2015-03-30 DIAGNOSIS — I10 Essential (primary) hypertension: Secondary | ICD-10-CM | POA: Diagnosis not present

## 2015-03-30 DIAGNOSIS — E1121 Type 2 diabetes mellitus with diabetic nephropathy: Secondary | ICD-10-CM | POA: Diagnosis not present

## 2015-04-16 ENCOUNTER — Ambulatory Visit (INDEPENDENT_AMBULATORY_CARE_PROVIDER_SITE_OTHER): Payer: Medicare Other | Admitting: Physician Assistant

## 2015-04-16 ENCOUNTER — Ambulatory Visit (INDEPENDENT_AMBULATORY_CARE_PROVIDER_SITE_OTHER): Payer: Medicare Other

## 2015-04-16 VITALS — BP 146/68 | HR 71 | Temp 97.8°F | Resp 16 | Ht 64.0 in | Wt 199.2 lb

## 2015-04-16 DIAGNOSIS — M25561 Pain in right knee: Secondary | ICD-10-CM

## 2015-04-16 DIAGNOSIS — M1711 Unilateral primary osteoarthritis, right knee: Secondary | ICD-10-CM

## 2015-04-16 MED ORDER — ACETAMINOPHEN 500 MG PO TABS: 1000.0000 mg | ORAL_TABLET | Freq: Four times a day (QID) | ORAL | Status: AC | PRN

## 2015-04-16 NOTE — Progress Notes (Signed)
04/16/2015 1:05 PM   DOB: 23-May-1940 / MRN: [id number]  SUBJECTIVE:  Mariah Lindsey is a 74 y.o. female presenting for two weeks of right sided knee pain.  She has a history of knee pain on same but reports it has suddenly worsened in the last two weeks.  Associates popping and clicking, and difficulty getting in and out of the car due to the pain.  Denies fever, chills nausea.  Has tried curcumin and 1 dose of aleve, both of which she thinks helped.     She is allergic to benadryl.   She  has a past medical history of Diabetes mellitus; Hypertension; Anxiety; Allergy; Arthritis; and Depression.    She  reports that she has never smoked. She has never used smokeless tobacco. She reports that she does not drink alcohol or use illicit drugs. She  reports that she does not engage in sexual activity. The patient  has past surgical history that includes Cholecystectomy (10/2004) and Abdominal hysterectomy (1972).  Her family history includes Asthma in her other; Cancer in her brother, mother, other, and sister; Hypertension in her mother.  Review of Systems  Respiratory: Negative for shortness of breath.   Cardiovascular: Negative for chest pain and orthopnea.  Gastrointestinal: Negative for nausea and abdominal pain.  Musculoskeletal: Positive for joint pain.  Skin: Negative for rash.    Problem list and medications reviewed and updated by myself where necessary, and exist elsewhere in the encounter.   OBJECTIVE:  BP 146/68 mmHg  Pulse 71  Temp(Src) 97.8 F (36.6 C) (Oral)  Resp 16  Ht  (1.626 m)  Wt 199 lb 4 oz (90.379 kg)  BMI 34.18 kg/m2  SpO2 96% CrCl cannot be calculated (Patient has no serum creatinine result on file.).  Physical Exam  Constitutional: She is oriented to person, place, and time. She appears well-developed.  Eyes: EOM are normal. Pupils are equal, round, and reactive to light.  Cardiovascular: Normal rate.   Pulmonary/Chest: Effort normal.  Abdominal:  She exhibits no distension.  Musculoskeletal:       Right knee: She exhibits decreased range of motion, swelling and bony tenderness. She exhibits no erythema, normal meniscus and no MCL laxity. Tenderness found. Medial joint line and lateral joint line tenderness noted. No MCL, no LCL and no patellar tendon tenderness noted.       Left knee: Normal.  Neurological: She is alert and oriented to person, place, and time. She has normal strength. No cranial nerve deficit or sensory deficit. Gait (antalgic) abnormal.  Skin: Skin is warm and dry. She is not diaphoretic.  Psychiatric: She has a normal mood and affect.  Vitals reviewed.  [facility] reading (PRIMARY) by  PA Catheleen Langhorne: STAT read please comment.   No results found for this or any previous visit (from the past 48 hour(s)).  ASSESSMENT AND PLAN  Mariah Lindsey was seen today for knee pain, toe pain, shoulder pain and muscle soreness.  Diagnoses and all orders for this visit:  Right knee pain -     DG Knee Complete 4 Views Right; Future  Primary osteoarthritis of right knee:  Patient opting for more conservative medication therapy at this time.  She will try the tylenol for 5 days and if her pain is not manageable she will call the office and I will step up therapy to Celexicoxib.   -     acetaminophen (TYLENOL) 500 MG tablet; Take 2 tablets (1,000 mg total) by mouth every 6 (six) hours as  needed.    The patient was advised to call or return to clinic if she does not see an improvement in symptoms or to seek the care of the closest emergency department if she worsens with the above plan.   Deliah BostonMichael Shemeika Starzyk, MHS, PA-C [facility] 04/16/2015 1:05 PM

## 2015-04-18 NOTE — Progress Notes (Signed)
  Medical screening examination/treatment/procedure(s) were performed by non-physician practitioner and as supervising physician I was immediately available for consultation/collaboration.     

## 2015-04-23 ENCOUNTER — Telehealth: Payer: Self-pay | Admitting: Physician Assistant

## 2015-04-23 NOTE — Telephone Encounter (Signed)
Patient started taking extra strength Tylenol on 04/16/2015 for her knee pain. She is not getting any relief at all. What do you suggest she do next?  [phone]

## 2015-04-24 NOTE — Telephone Encounter (Signed)
Plan is for her to try the tylenol for 5 days and if her pain is not manageable she will call the office and I will step up therapy to Celexicoxib. Ok to send in the  Celecoxib, as per plan from Dr Dareen PianoAnderson ?

## 2015-04-25 NOTE — Telephone Encounter (Signed)
Lets do Celebrex 200 mg qd and advise that she also continue tylenol prn. Please write for me and I will cosign. THANKS!  Deliah BostonMichael Kourtnei Rauber, MS, PA-C 9:29 AM, 04/25/2015

## 2015-05-03 ENCOUNTER — Telehealth: Payer: Self-pay

## 2015-05-03 NOTE — Telephone Encounter (Signed)
Pt states she was in to be seen about two weeks ago and the pain medication she was given was told to take for seven days has not helped and she would like to know what to do next

## 2015-05-03 NOTE — Telephone Encounter (Signed)
Primary osteoarthritis of right knee: Patient opting for more conservative medication therapy at this time. She will try the tylenol for 5 days and if her pain is not manageable she will call the office and I will step up therapy to Celexicoxib.  - acetaminophen (TYLENOL) 500 MG tablet; Take 2 tablets (1,000 mg total) by mouth every 6 (six) hours as needed.

## 2015-05-05 MED ORDER — CELECOXIB 200 MG PO CAPS: 200.0000 mg | ORAL_CAPSULE | Freq: Two times a day (BID) | ORAL | Status: AC

## 2015-05-05 NOTE — Telephone Encounter (Signed)
Patient is returning a missed phone call. She states that she did not get a Engineer, technical salesvoicemail

## 2015-05-05 NOTE — Telephone Encounter (Signed)
Left message for pt to call back  °

## 2015-05-05 NOTE — Telephone Encounter (Signed)
Please call in Celebrex 200 mg qd, #30, 4 refills. Please make pt aware and I'm sorry for the delay. Than you T.

## 2015-05-05 NOTE — Telephone Encounter (Signed)
Pt.notified

## 2015-06-30 DIAGNOSIS — E785 Hyperlipidemia, unspecified: Secondary | ICD-10-CM | POA: Diagnosis not present

## 2015-06-30 DIAGNOSIS — I10 Essential (primary) hypertension: Secondary | ICD-10-CM | POA: Diagnosis not present

## 2015-06-30 DIAGNOSIS — E1165 Type 2 diabetes mellitus with hyperglycemia: Secondary | ICD-10-CM | POA: Diagnosis not present

## 2015-06-30 DIAGNOSIS — Z7984 Long term (current) use of oral hypoglycemic drugs: Secondary | ICD-10-CM | POA: Diagnosis not present

## 2015-06-30 DIAGNOSIS — M1711 Unilateral primary osteoarthritis, right knee: Secondary | ICD-10-CM | POA: Diagnosis not present

## 2015-07-05 ENCOUNTER — Telehealth: Payer: Self-pay | Admitting: *Deleted

## 2015-07-05 NOTE — Telephone Encounter (Signed)
Called patient to offer flu vaccine, however, VM picked up.  Left message on patient's voice mail to return call. DUCATTE, LAURENZE L, RN   

## 2015-07-11 ENCOUNTER — Other Ambulatory Visit: Payer: Self-pay | Admitting: *Deleted

## 2015-07-11 DIAGNOSIS — I10 Essential (primary) hypertension: Secondary | ICD-10-CM

## 2015-07-11 MED ORDER — LOSARTAN POTASSIUM 100 MG PO TABS: 100.0000 mg | ORAL_TABLET | Freq: Every day | ORAL | Status: AC

## 2015-07-11 NOTE — Telephone Encounter (Signed)
One refill on losartan provided. Patient has not been seen in our clinic in > 1 year (was supposed to follow up 9 months ago). Please have her make an appt with me so we can continue to safely prescribe these medications.  Thanks, Joanna Puff, MD [facility] Family [facility] Resident  07/11/2015, 8:06 PM

## 2015-07-13 NOTE — Telephone Encounter (Signed)
Left message on voicemail informing of message below, asked that patient call back to schedule an appointment. 

## 2015-11-29 DIAGNOSIS — Z7984 Long term (current) use of oral hypoglycemic drugs: Secondary | ICD-10-CM | POA: Diagnosis not present

## 2015-11-29 DIAGNOSIS — I10 Essential (primary) hypertension: Secondary | ICD-10-CM | POA: Diagnosis not present

## 2015-11-29 DIAGNOSIS — H9201 Otalgia, right ear: Secondary | ICD-10-CM | POA: Diagnosis not present

## 2015-11-29 DIAGNOSIS — E1165 Type 2 diabetes mellitus with hyperglycemia: Secondary | ICD-10-CM | POA: Diagnosis not present

## 2015-11-29 DIAGNOSIS — E785 Hyperlipidemia, unspecified: Secondary | ICD-10-CM | POA: Diagnosis not present

## 2016-01-31 ENCOUNTER — Other Ambulatory Visit: Payer: Self-pay | Admitting: Family Medicine

## 2016-01-31 DIAGNOSIS — I10 Essential (primary) hypertension: Secondary | ICD-10-CM

## 2016-03-08 DIAGNOSIS — Z7984 Long term (current) use of oral hypoglycemic drugs: Secondary | ICD-10-CM | POA: Diagnosis not present

## 2016-03-08 DIAGNOSIS — I10 Essential (primary) hypertension: Secondary | ICD-10-CM | POA: Diagnosis not present

## 2016-03-08 DIAGNOSIS — E1121 Type 2 diabetes mellitus with diabetic nephropathy: Secondary | ICD-10-CM | POA: Diagnosis not present

## 2016-03-08 DIAGNOSIS — E785 Hyperlipidemia, unspecified: Secondary | ICD-10-CM | POA: Diagnosis not present

## 2016-03-08 DIAGNOSIS — R21 Rash and other nonspecific skin eruption: Secondary | ICD-10-CM | POA: Diagnosis not present

## 2016-03-13 IMAGING — CR DG KNEE COMPLETE 4+V*R*
4 series · 4 of 4 positions shown · non-contrast
Comparison: None.

CLINICAL DATA: Right knee pain worsened normal over the past 2
weeks

EXAM:
RIGHT KNEE - COMPLETE 4+ VIEW

[AP]
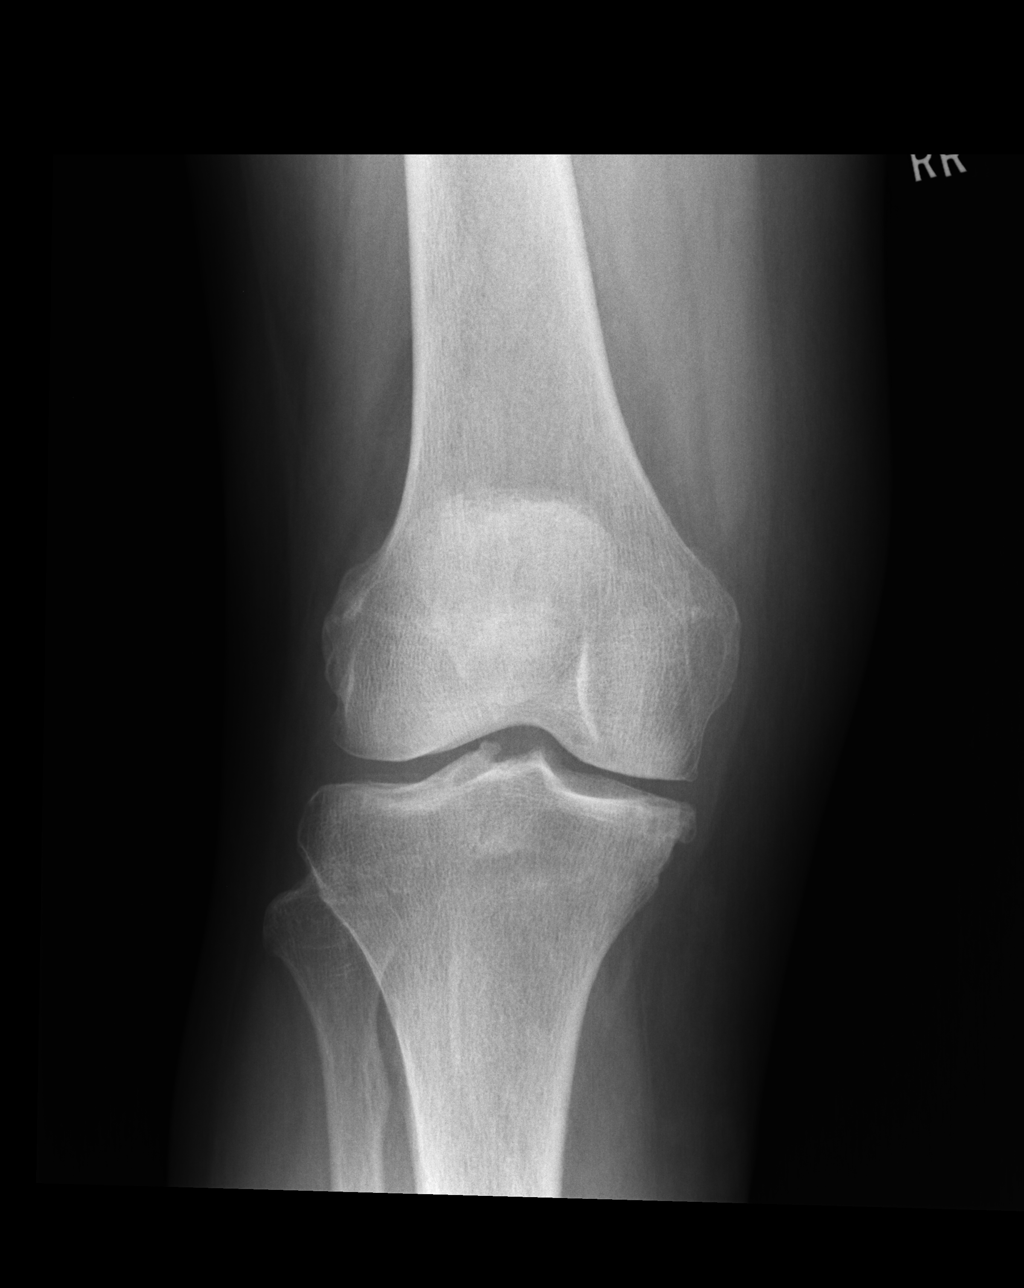

[lateral]
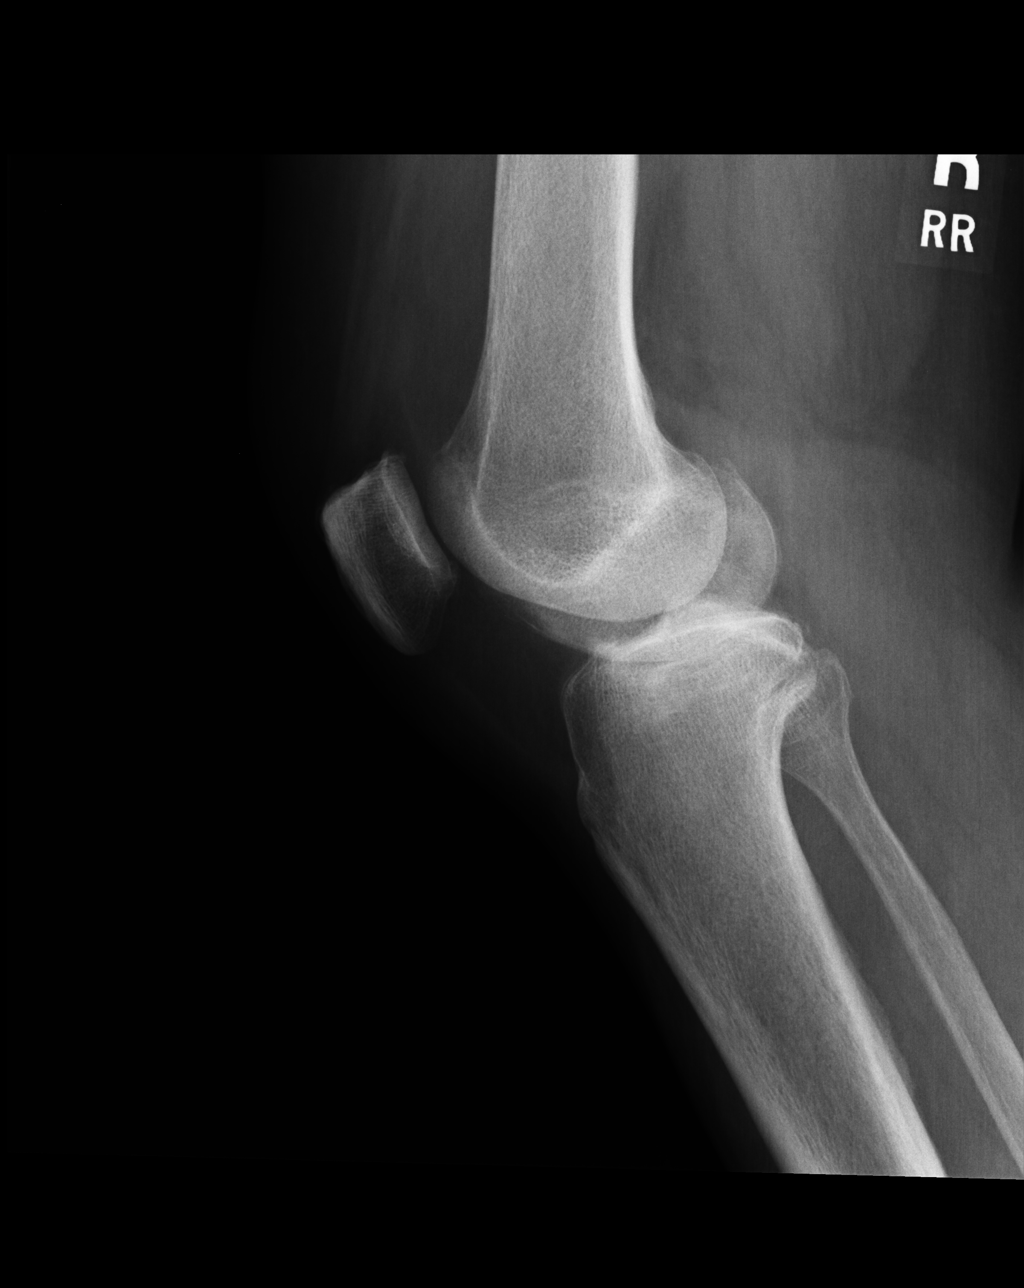

[sunrise]
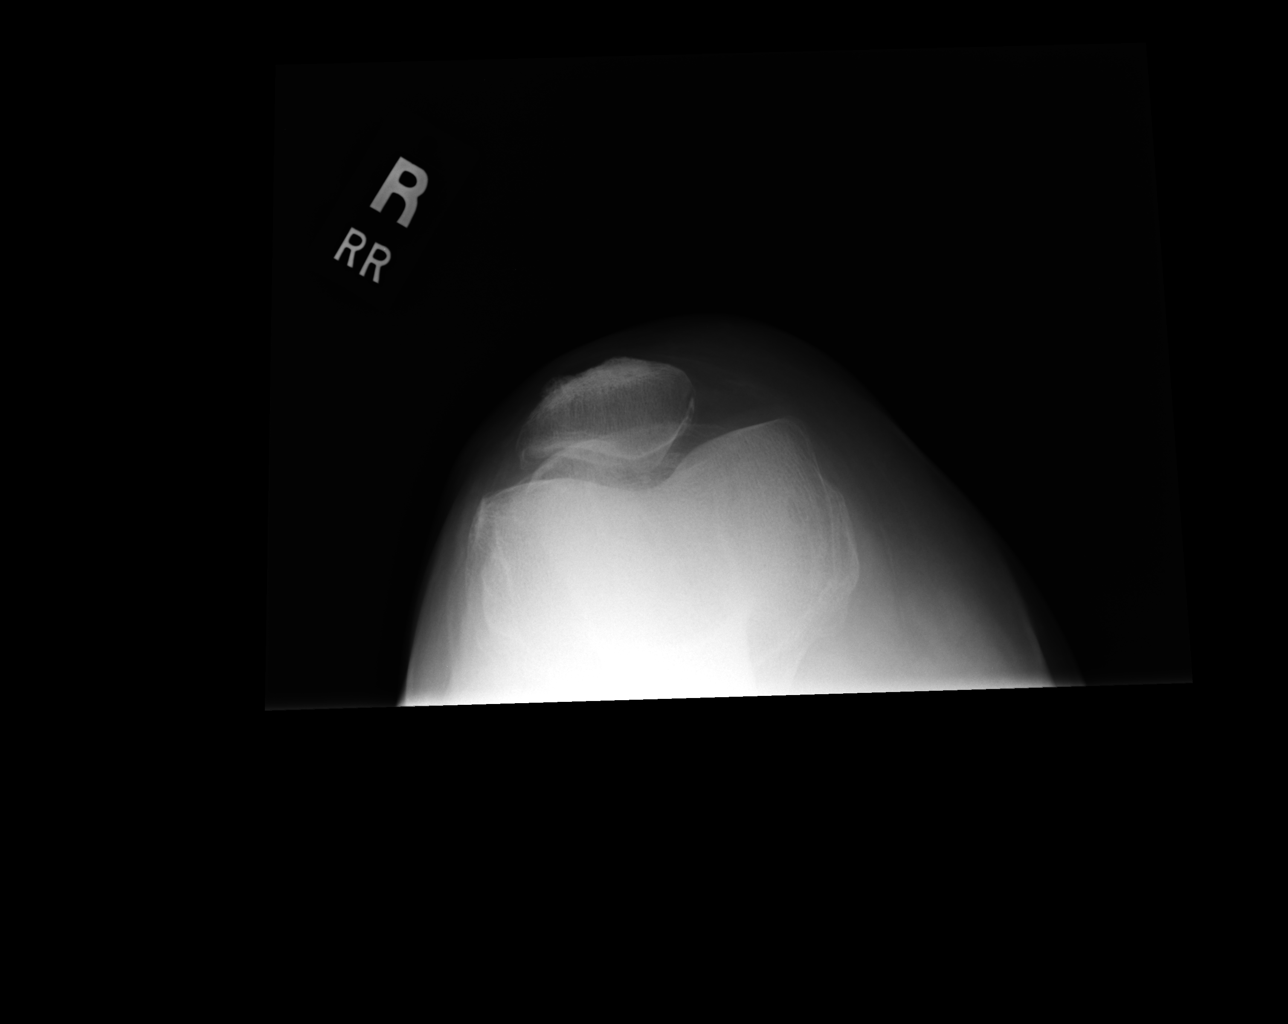

[pa axial]
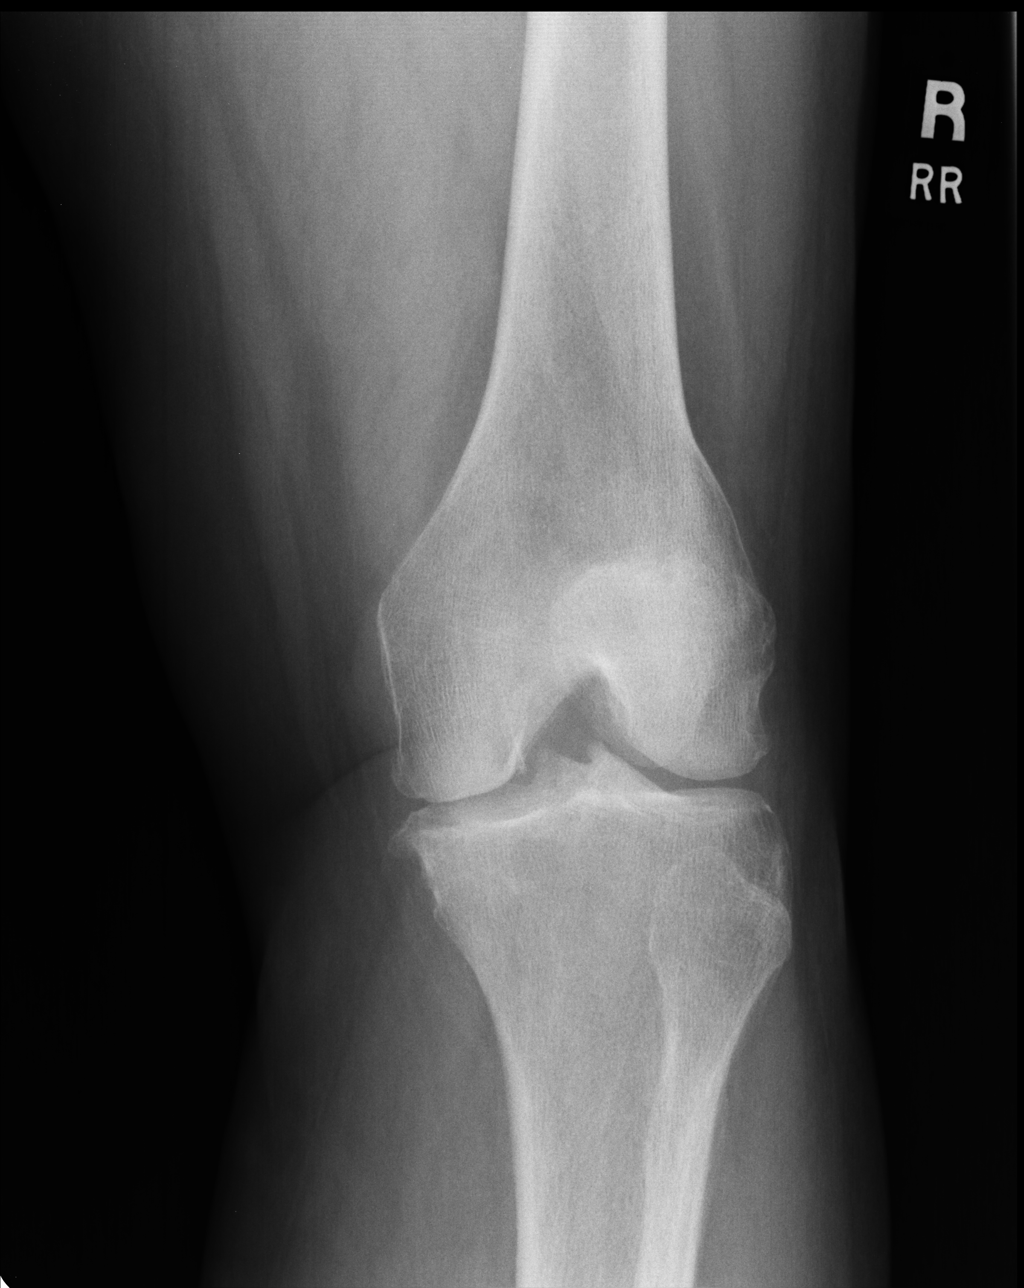

[4 of 4 positions shown; findings below may reference images not displayed]

FINDINGS: There is no evidence of fracture, dislocation, or joint effusion.

Knee osteoarthritis with diffuse marginal spurring and mild medial
compartment narrowing.
IMPRESSION: 1. No acute finding.
2. Knee osteoarthritis with moderate spurring and mild joint
narrowing.

## 2016-04-30 DIAGNOSIS — Z1231 Encounter for screening mammogram for malignant neoplasm of breast: Secondary | ICD-10-CM | POA: Diagnosis not present

## 2016-05-02 DIAGNOSIS — I10 Essential (primary) hypertension: Secondary | ICD-10-CM | POA: Diagnosis not present

## 2016-05-02 DIAGNOSIS — E1121 Type 2 diabetes mellitus with diabetic nephropathy: Secondary | ICD-10-CM | POA: Diagnosis not present

## 2016-05-02 DIAGNOSIS — Z1389 Encounter for screening for other disorder: Secondary | ICD-10-CM | POA: Diagnosis not present

## 2016-05-02 DIAGNOSIS — E559 Vitamin D deficiency, unspecified: Secondary | ICD-10-CM | POA: Diagnosis not present

## 2016-05-02 DIAGNOSIS — Z23 Encounter for immunization: Secondary | ICD-10-CM | POA: Diagnosis not present

## 2016-05-02 DIAGNOSIS — Z7984 Long term (current) use of oral hypoglycemic drugs: Secondary | ICD-10-CM | POA: Diagnosis not present

## 2016-05-02 DIAGNOSIS — Z0000 Encounter for general adult medical examination without abnormal findings: Secondary | ICD-10-CM | POA: Diagnosis not present

## 2016-05-02 DIAGNOSIS — E785 Hyperlipidemia, unspecified: Secondary | ICD-10-CM | POA: Diagnosis not present

## 2016-06-05 DIAGNOSIS — E1121 Type 2 diabetes mellitus with diabetic nephropathy: Secondary | ICD-10-CM | POA: Diagnosis not present

## 2016-06-05 DIAGNOSIS — E785 Hyperlipidemia, unspecified: Secondary | ICD-10-CM | POA: Diagnosis not present

## 2016-06-05 DIAGNOSIS — Z7984 Long term (current) use of oral hypoglycemic drugs: Secondary | ICD-10-CM | POA: Diagnosis not present

## 2016-06-05 DIAGNOSIS — E1165 Type 2 diabetes mellitus with hyperglycemia: Secondary | ICD-10-CM | POA: Diagnosis not present

## 2016-06-14 DIAGNOSIS — Z7984 Long term (current) use of oral hypoglycemic drugs: Secondary | ICD-10-CM | POA: Diagnosis not present

## 2016-06-14 DIAGNOSIS — E1121 Type 2 diabetes mellitus with diabetic nephropathy: Secondary | ICD-10-CM | POA: Diagnosis not present

## 2016-10-16 DIAGNOSIS — E1165 Type 2 diabetes mellitus with hyperglycemia: Secondary | ICD-10-CM | POA: Diagnosis not present

## 2016-10-16 DIAGNOSIS — Z780 Asymptomatic menopausal state: Secondary | ICD-10-CM | POA: Diagnosis not present

## 2016-10-16 DIAGNOSIS — I10 Essential (primary) hypertension: Secondary | ICD-10-CM | POA: Diagnosis not present

## 2016-10-16 DIAGNOSIS — Z7984 Long term (current) use of oral hypoglycemic drugs: Secondary | ICD-10-CM | POA: Diagnosis not present

## 2017-02-20 DIAGNOSIS — M8588 Other specified disorders of bone density and structure, other site: Secondary | ICD-10-CM | POA: Diagnosis not present

## 2017-02-20 DIAGNOSIS — Z780 Asymptomatic menopausal state: Secondary | ICD-10-CM | POA: Diagnosis not present

## 2017-03-27 DIAGNOSIS — I10 Essential (primary) hypertension: Secondary | ICD-10-CM | POA: Diagnosis not present

## 2017-03-27 DIAGNOSIS — B379 Candidiasis, unspecified: Secondary | ICD-10-CM | POA: Diagnosis not present

## 2017-05-17 DIAGNOSIS — M545 Low back pain: Secondary | ICD-10-CM | POA: Diagnosis not present

## 2017-05-17 DIAGNOSIS — E785 Hyperlipidemia, unspecified: Secondary | ICD-10-CM | POA: Diagnosis not present

## 2017-05-17 DIAGNOSIS — E1165 Type 2 diabetes mellitus with hyperglycemia: Secondary | ICD-10-CM | POA: Diagnosis not present

## 2017-05-17 DIAGNOSIS — Z0000 Encounter for general adult medical examination without abnormal findings: Secondary | ICD-10-CM | POA: Diagnosis not present

## 2017-05-17 DIAGNOSIS — E559 Vitamin D deficiency, unspecified: Secondary | ICD-10-CM | POA: Diagnosis not present

## 2017-05-17 DIAGNOSIS — M85859 Other specified disorders of bone density and structure, unspecified thigh: Secondary | ICD-10-CM | POA: Diagnosis not present

## 2017-05-17 DIAGNOSIS — Z1389 Encounter for screening for other disorder: Secondary | ICD-10-CM | POA: Diagnosis not present

## 2017-05-17 DIAGNOSIS — Z23 Encounter for immunization: Secondary | ICD-10-CM | POA: Diagnosis not present

## 2017-05-17 DIAGNOSIS — H6123 Impacted cerumen, bilateral: Secondary | ICD-10-CM | POA: Diagnosis not present

## 2017-07-17 DIAGNOSIS — E119 Type 2 diabetes mellitus without complications: Secondary | ICD-10-CM | POA: Diagnosis not present

## 2017-08-21 DIAGNOSIS — M1990 Unspecified osteoarthritis, unspecified site: Secondary | ICD-10-CM | POA: Diagnosis not present

## 2017-08-21 DIAGNOSIS — J209 Acute bronchitis, unspecified: Secondary | ICD-10-CM | POA: Diagnosis not present

## 2017-08-21 DIAGNOSIS — I10 Essential (primary) hypertension: Secondary | ICD-10-CM | POA: Diagnosis not present

## 2017-11-18 DIAGNOSIS — R51 Headache: Secondary | ICD-10-CM | POA: Diagnosis not present

## 2017-11-18 DIAGNOSIS — I10 Essential (primary) hypertension: Secondary | ICD-10-CM | POA: Diagnosis not present

## 2017-11-18 DIAGNOSIS — E1165 Type 2 diabetes mellitus with hyperglycemia: Secondary | ICD-10-CM | POA: Diagnosis not present

## 2017-11-18 DIAGNOSIS — F419 Anxiety disorder, unspecified: Secondary | ICD-10-CM | POA: Diagnosis not present

## 2017-12-10 ENCOUNTER — Other Ambulatory Visit: Payer: Self-pay

## 2017-12-10 NOTE — Patient Outreach (Addendum)
[facility]) Care Management  12/10/2017  Mariah BreslowMadeline Lindsey 07/14/1940 [id number]   Referral Date: 12/09/17 Referral Source: MD referral Referral Reason: Diabetes Management  Patient immediately called back.  Patient able to verify HIPAA.  Discussed with patient reason for referral.  Patient expresses some concern about medication and cost of Januvia and wondered if there was a generic she could take. Patient states that she has called physician to inquire but has not heard back yet.  Discussed with patient how [facility] could help an support patient in her diabetes.  Patient states that she does not want to make a decision until she talks with her family.  Advised patient that would be fine and that CM would send letter and brochure about [facility] and notify physician.  Also advised patient to call CM back if she decides on [facility].  She verbalized understanding.   Plan: RN CM will send a letter and brochure.  RN CM will notify PCP of case status.   RN CM will close case at this time.   Update: Telephone call to Dr. Annie MainVaradarjan to update on case status.  Message left on nurses voice mail.   Bary Lericheionne J Raydell Maners, RN, MSN [facility] Care Management Care Management Coordinator Direct Line [phone] Toll Free: [phone]  Fax: [phone]

## 2018-06-30 DIAGNOSIS — E1165 Type 2 diabetes mellitus with hyperglycemia: Secondary | ICD-10-CM | POA: Diagnosis not present

## 2018-06-30 DIAGNOSIS — I10 Essential (primary) hypertension: Secondary | ICD-10-CM | POA: Diagnosis not present

## 2018-06-30 DIAGNOSIS — J0110 Acute frontal sinusitis, unspecified: Secondary | ICD-10-CM | POA: Diagnosis not present

## 2018-09-10 DIAGNOSIS — E1121 Type 2 diabetes mellitus with diabetic nephropathy: Secondary | ICD-10-CM | POA: Diagnosis not present

## 2018-09-10 DIAGNOSIS — I10 Essential (primary) hypertension: Secondary | ICD-10-CM | POA: Diagnosis not present

## 2019-02-19 DIAGNOSIS — Z7984 Long term (current) use of oral hypoglycemic drugs: Secondary | ICD-10-CM | POA: Diagnosis not present

## 2019-02-19 DIAGNOSIS — E1121 Type 2 diabetes mellitus with diabetic nephropathy: Secondary | ICD-10-CM | POA: Diagnosis not present

## 2019-02-19 DIAGNOSIS — I10 Essential (primary) hypertension: Secondary | ICD-10-CM | POA: Diagnosis not present

## 2019-02-19 DIAGNOSIS — Z23 Encounter for immunization: Secondary | ICD-10-CM | POA: Diagnosis not present

## 2019-02-19 DIAGNOSIS — E119 Type 2 diabetes mellitus without complications: Secondary | ICD-10-CM | POA: Diagnosis not present

## 2019-02-25 DIAGNOSIS — E1121 Type 2 diabetes mellitus with diabetic nephropathy: Secondary | ICD-10-CM | POA: Diagnosis not present

## 2019-04-08 DIAGNOSIS — E1121 Type 2 diabetes mellitus with diabetic nephropathy: Secondary | ICD-10-CM | POA: Diagnosis not present

## 2019-04-08 DIAGNOSIS — E1165 Type 2 diabetes mellitus with hyperglycemia: Secondary | ICD-10-CM | POA: Diagnosis not present

## 2019-04-08 DIAGNOSIS — M1990 Unspecified osteoarthritis, unspecified site: Secondary | ICD-10-CM | POA: Diagnosis not present

## 2019-04-08 DIAGNOSIS — I10 Essential (primary) hypertension: Secondary | ICD-10-CM | POA: Diagnosis not present

## 2019-04-08 DIAGNOSIS — M1711 Unilateral primary osteoarthritis, right knee: Secondary | ICD-10-CM | POA: Diagnosis not present

## 2019-04-08 DIAGNOSIS — E785 Hyperlipidemia, unspecified: Secondary | ICD-10-CM | POA: Diagnosis not present

## 2019-04-22 ENCOUNTER — Other Ambulatory Visit: Payer: Self-pay | Admitting: Internal Medicine

## 2019-04-22 DIAGNOSIS — Z1389 Encounter for screening for other disorder: Secondary | ICD-10-CM | POA: Diagnosis not present

## 2019-04-22 DIAGNOSIS — M8588 Other specified disorders of bone density and structure, other site: Secondary | ICD-10-CM

## 2019-04-22 DIAGNOSIS — Z0000 Encounter for general adult medical examination without abnormal findings: Secondary | ICD-10-CM | POA: Diagnosis not present

## 2019-06-18 DIAGNOSIS — Z1231 Encounter for screening mammogram for malignant neoplasm of breast: Secondary | ICD-10-CM | POA: Diagnosis not present

## 2019-06-19 ENCOUNTER — Ambulatory Visit: Payer: Medicare Other | Attending: Internal Medicine

## 2019-06-19 DIAGNOSIS — Z23 Encounter for immunization: Secondary | ICD-10-CM | POA: Insufficient documentation

## 2019-06-19 NOTE — Progress Notes (Signed)
   Covid-19 Vaccination Clinic  Name:  Carlyn Lemke    MRN: [id number] DOB: 1941/01/13  06/19/2019  Ms. Cundy was observed post Covid-19 immunization for 15 minutes without incidence. She was provided with Vaccine Information Sheet and instruction to access the V-Safe system.   Ms. Osbourn was instructed to call 911 with any severe reactions post vaccine: Marland Kitchen Difficulty breathing  . Swelling of your face and throat  . A fast heartbeat  . A bad rash all over your body  . Dizziness and weakness    Immunizations Administered    Name Date Dose VIS Date Route   Pfizer COVID-19 Vaccine 06/19/2019  4:58 PM 0.3 mL 04/24/2019 Intramuscular   Manufacturer: [employer], [employer]   Lot: [id number]   NDC: [address]-2

## 2019-06-20 ENCOUNTER — Ambulatory Visit: Payer: Self-pay

## 2019-06-26 DIAGNOSIS — R922 Inconclusive mammogram: Secondary | ICD-10-CM | POA: Diagnosis not present

## 2019-06-26 DIAGNOSIS — R921 Mammographic calcification found on diagnostic imaging of breast: Secondary | ICD-10-CM | POA: Diagnosis not present

## 2019-07-08 DIAGNOSIS — E119 Type 2 diabetes mellitus without complications: Secondary | ICD-10-CM | POA: Diagnosis not present

## 2019-07-14 ENCOUNTER — Ambulatory Visit: Payer: Medicare Other | Attending: Internal Medicine

## 2019-07-14 DIAGNOSIS — Z23 Encounter for immunization: Secondary | ICD-10-CM

## 2019-07-14 NOTE — Progress Notes (Signed)
   Covid-19 Vaccination Clinic  Name:  Anna Beaird    MRN: [id number] DOB: 1940/06/28  07/14/2019  Ms. Tyrell was observed post Covid-19 immunization for 15 minutes without incident. She was provided with Vaccine Information Sheet and instruction to access the V-Safe system.   Ms. Paschal was instructed to call 911 with any severe reactions post vaccine: Marland Kitchen Difficulty breathing  . Swelling of face and throat  . A fast heartbeat  . A bad rash all over body  . Dizziness and weakness   Immunizations Administered    Name Date Dose VIS Date Route   Pfizer COVID-19 Vaccine 07/14/2019  4:04 PM 0.3 mL 04/24/2019 Intramuscular   Manufacturer: [employer], [employer]   Lot: [id number]   NDC: [address]-2      Covid-19 Vaccination Clinic  Name:  [place]    MRN: [id number] DOB: June 13, 1940  07/14/2019  Ms. Horace was observed post Covid-19 immunization for 15 minutes without incident. She was provided with Vaccine Information Sheet and instruction to access the V-Safe system.   Ms. Treloar was instructed to call 911 with any severe reactions post vaccine: Marland Kitchen Difficulty breathing  . Swelling of face and throat  . A fast heartbeat  . A bad rash all over body  . Dizziness and weakness   Immunizations Administered    Name Date Dose VIS Date Route   Pfizer COVID-19 Vaccine 07/14/2019  4:04 PM 0.3 mL 04/24/2019 Intramuscular   Manufacturer: [employer], [employer]   Lot: [id number]   NDC: [address]-4

## 2019-07-21 ENCOUNTER — Other Ambulatory Visit: Payer: Medicare Other

## 2020-04-06 DIAGNOSIS — R921 Mammographic calcification found on diagnostic imaging of breast: Secondary | ICD-10-CM | POA: Diagnosis not present

## 2020-06-20 DIAGNOSIS — E785 Hyperlipidemia, unspecified: Secondary | ICD-10-CM | POA: Diagnosis not present

## 2020-06-20 DIAGNOSIS — Z7189 Other specified counseling: Secondary | ICD-10-CM | POA: Diagnosis not present

## 2020-06-20 DIAGNOSIS — E1165 Type 2 diabetes mellitus with hyperglycemia: Secondary | ICD-10-CM | POA: Diagnosis not present

## 2020-06-20 DIAGNOSIS — I10 Essential (primary) hypertension: Secondary | ICD-10-CM | POA: Diagnosis not present

## 2020-06-20 DIAGNOSIS — Z23 Encounter for immunization: Secondary | ICD-10-CM | POA: Diagnosis not present

## 2020-06-20 DIAGNOSIS — Z1389 Encounter for screening for other disorder: Secondary | ICD-10-CM | POA: Diagnosis not present

## 2020-06-20 DIAGNOSIS — Z0000 Encounter for general adult medical examination without abnormal findings: Secondary | ICD-10-CM | POA: Diagnosis not present

## 2020-06-20 DIAGNOSIS — M1711 Unilateral primary osteoarthritis, right knee: Secondary | ICD-10-CM | POA: Diagnosis not present

## 2020-06-20 DIAGNOSIS — R634 Abnormal weight loss: Secondary | ICD-10-CM | POA: Diagnosis not present

## 2020-06-22 DIAGNOSIS — E1165 Type 2 diabetes mellitus with hyperglycemia: Secondary | ICD-10-CM | POA: Diagnosis not present

## 2020-06-22 DIAGNOSIS — Z7984 Long term (current) use of oral hypoglycemic drugs: Secondary | ICD-10-CM | POA: Diagnosis not present

## 2020-08-10 DIAGNOSIS — E1165 Type 2 diabetes mellitus with hyperglycemia: Secondary | ICD-10-CM | POA: Diagnosis not present

## 2020-08-10 DIAGNOSIS — I10 Essential (primary) hypertension: Secondary | ICD-10-CM | POA: Diagnosis not present

## 2020-08-10 DIAGNOSIS — Z7984 Long term (current) use of oral hypoglycemic drugs: Secondary | ICD-10-CM | POA: Diagnosis not present

## 2020-08-10 DIAGNOSIS — Z1231 Encounter for screening mammogram for malignant neoplasm of breast: Secondary | ICD-10-CM | POA: Diagnosis not present

## 2020-10-25 DIAGNOSIS — F419 Anxiety disorder, unspecified: Secondary | ICD-10-CM | POA: Diagnosis not present

## 2020-10-25 DIAGNOSIS — G4700 Insomnia, unspecified: Secondary | ICD-10-CM | POA: Diagnosis not present

## 2020-10-25 DIAGNOSIS — E1165 Type 2 diabetes mellitus with hyperglycemia: Secondary | ICD-10-CM | POA: Diagnosis not present

## 2020-10-25 DIAGNOSIS — M1711 Unilateral primary osteoarthritis, right knee: Secondary | ICD-10-CM | POA: Diagnosis not present

## 2020-10-25 DIAGNOSIS — E559 Vitamin D deficiency, unspecified: Secondary | ICD-10-CM | POA: Diagnosis not present

## 2020-10-25 DIAGNOSIS — I10 Essential (primary) hypertension: Secondary | ICD-10-CM | POA: Diagnosis not present

## 2021-02-21 DIAGNOSIS — E785 Hyperlipidemia, unspecified: Secondary | ICD-10-CM | POA: Diagnosis not present

## 2021-02-21 DIAGNOSIS — Z23 Encounter for immunization: Secondary | ICD-10-CM | POA: Diagnosis not present

## 2021-02-21 DIAGNOSIS — I10 Essential (primary) hypertension: Secondary | ICD-10-CM | POA: Diagnosis not present

## 2021-07-04 DIAGNOSIS — M85859 Other specified disorders of bone density and structure, unspecified thigh: Secondary | ICD-10-CM | POA: Diagnosis not present

## 2021-07-04 DIAGNOSIS — Z0000 Encounter for general adult medical examination without abnormal findings: Secondary | ICD-10-CM | POA: Diagnosis not present

## 2021-07-04 DIAGNOSIS — Z1389 Encounter for screening for other disorder: Secondary | ICD-10-CM | POA: Diagnosis not present

## 2021-07-04 DIAGNOSIS — E1165 Type 2 diabetes mellitus with hyperglycemia: Secondary | ICD-10-CM | POA: Diagnosis not present

## 2021-07-04 DIAGNOSIS — Z7984 Long term (current) use of oral hypoglycemic drugs: Secondary | ICD-10-CM | POA: Diagnosis not present

## 2021-07-04 DIAGNOSIS — E785 Hyperlipidemia, unspecified: Secondary | ICD-10-CM | POA: Diagnosis not present

## 2021-07-04 DIAGNOSIS — E559 Vitamin D deficiency, unspecified: Secondary | ICD-10-CM | POA: Diagnosis not present

## 2021-07-04 DIAGNOSIS — I10 Essential (primary) hypertension: Secondary | ICD-10-CM | POA: Diagnosis not present

## 2021-07-04 DIAGNOSIS — H900 Conductive hearing loss, bilateral: Secondary | ICD-10-CM | POA: Diagnosis not present

## 2021-07-05 DIAGNOSIS — E1165 Type 2 diabetes mellitus with hyperglycemia: Secondary | ICD-10-CM | POA: Diagnosis not present

## 2021-07-05 DIAGNOSIS — H6123 Impacted cerumen, bilateral: Secondary | ICD-10-CM | POA: Diagnosis not present

## 2021-07-10 DIAGNOSIS — H6123 Impacted cerumen, bilateral: Secondary | ICD-10-CM | POA: Diagnosis not present

## 2021-08-01 DIAGNOSIS — E119 Type 2 diabetes mellitus without complications: Secondary | ICD-10-CM | POA: Diagnosis not present

## 2021-08-22 ENCOUNTER — Ambulatory Visit: Payer: BC Managed Care – PPO | Admitting: Podiatry

## 2021-08-22 DIAGNOSIS — M79675 Pain in left toe(s): Secondary | ICD-10-CM

## 2021-08-22 DIAGNOSIS — B351 Tinea unguium: Secondary | ICD-10-CM

## 2021-08-22 DIAGNOSIS — M79674 Pain in right toe(s): Secondary | ICD-10-CM

## 2021-08-22 NOTE — Progress Notes (Signed)
Subjective:  ? ?Patient ID: Mariah Lindsey, female   DOB: 81 y.o.   MRN: [id number]  ? ?HPI ?81 year old female presents the office today for concerns of her nails turning and getting thickened elongated she has difficulty trimming them herself.  Denies any swelling or redness or any drainage.  No open lesions.  She is diabetic and last A1c was 6.  She has not checked her last glucose.  No numbness or tingling.  She has no cramping of legs with walking but occasional cramping at night. ? ? ?Review of Systems  ?All other systems reviewed and are negative. ? ?Past Medical History:  ?Diagnosis Date  ? Allergy   ? Anxiety   ? Arthritis   ? Depression   ? Diabetes mellitus   ? Hypertension   ? ? ?Past Surgical History:  ?Procedure Laterality Date  ? ABDOMINAL HYSTERECTOMY  1972  ? ovaries remain, hysterectomy done for "precancerous cells", never diagnosed with malignancy  ? CHOLECYSTECTOMY  10/2004  ? ? ? ?Current Outpatient Medications:  ?  acetaminophen (TYLENOL) 500 MG tablet, Take 2 tablets (1,000 mg total) by mouth every 6 (six) hours as needed., Disp: 180 tablet, Rfl: 11 ?  celecoxib (CELEBREX) 200 MG capsule, Take 1 capsule (200 mg total) by mouth 2 (two) times daily., Disp: 30 capsule, Rfl: 4 ?  chlorthalidone (HYGROTON) 25 MG tablet, Take 1 tablet (25 mg total) by mouth daily., Disp: 30 tablet, Rfl: 3 ?  losartan (COZAAR) 100 MG tablet, Take 1 tablet (100 mg total) by mouth daily., Disp: 90 tablet, Rfl: 0 ?  metFORMIN (GLUCOPHAGE) 500 MG tablet, Take 1 tablet (500 mg total) by mouth 2 (two) times daily with a meal., Disp: 60 tablet, Rfl: 3 ?  sertraline (ZOLOFT) 25 MG tablet, Take 1 tablet (25 mg total) by mouth daily., Disp: 30 tablet, Rfl: 3 ? ?Allergies  ?Allergen Reactions  ? Benadryl [Diphenhydramine Hcl]   ?  Unknown if all Benedryl products  ? ? ? ? ?   ?Objective:  ?Physical Exam  ?General: AAO x3, NAD ? ?Dermatological: Nails are hypertrophic, dystrophic, brittle, discolored, elongated ?10.   Incurvation of the nails without any signs of infection.  No surrounding redness or drainage. Tenderness nails 1-5 bilaterally. No open lesions or pre-ulcerative lesions are identified today. ? ?Vascular: Dorsalis Pedis artery and Posterior Tibial artery pedal pulses are palpable bilateral with immedate capillary fill time. There is no pain with calf compression, swelling, warmth, erythema.  ? ?Neruologic: Grossly intact via light touch bilateral.  Sensation intact with Phoebe Perch monofilament ? ?Musculoskeletal: Muscular strength 5/5 in all groups tested bilateral. ? ?   ?Assessment:  ? ?Symptomatic onychomycosis ? ?   ?Plan:  ?-Treatment options discussed including all alternatives, risks, and complications ?-Etiology of symptoms were discussed ?-Nails debrided ?10 without complications or bleeding. ?-Discussed different topical creams for the cramping including Theraworx.  ?-Daily foot inspection ?-Follow-up in 3 months or sooner if any problems arise. In the meantime, encouraged to call the office with any questions, concerns, change in symptoms.  ? ?Ovid Curd, DPM ? ?   ? ?

## 2021-08-22 NOTE — Patient Instructions (Signed)
You can use "theraworx" for the cramping ? ?Diabetes Mellitus and Foot Care ?Foot care is an important part of your health, especially when you have diabetes. Diabetes may cause you to have problems because of poor blood flow (circulation) to your feet and legs, which can cause your skin to: ?Become thinner and drier. ?Break more easily. ?Heal more slowly. ?Peel and crack. ?You may also have nerve damage (neuropathy) in your legs and feet, causing decreased feeling in them. This means that you may not notice minor injuries to your feet that could lead to more serious problems. Noticing and addressing any potential problems early is the best way to prevent future foot problems. ?How to care for your feet ?Foot hygiene ? ?Wash your feet daily with warm water and mild soap. Do not use hot water. Then, pat your feet and the areas between your toes until they are completely dry. Do not soak your feet as this can dry your skin. ?Trim your toenails straight across. Do not dig under them or around the cuticle. File the edges of your nails with an emery board or nail file. ?Apply a moisturizing lotion or petroleum jelly to the skin on your feet and to dry, brittle toenails. Use lotion that does not contain alcohol and is unscented. Do not apply lotion between your toes. ?Shoes and socks ?Wear clean socks or stockings every day. Make sure they are not too tight. Do not wear knee-high stockings since they may decrease blood flow to your legs. ?Wear shoes that fit properly and have enough cushioning. Always look in your shoes before you put them on to be sure there are no objects inside. ?To break in new shoes, wear them for just a few hours a day. This prevents injuries on your feet. ?Wounds, scrapes, corns, and calluses ? ?Check your feet daily for blisters, cuts, bruises, sores, and redness. If you cannot see the bottom of your feet, use a mirror or ask someone for help. ?Do not cut corns or calluses or try to remove them  with medicine. ?If you find a minor scrape, cut, or break in the skin on your feet, keep it and the skin around it clean and dry. You may clean these areas with mild soap and water. Do not clean the area with peroxide, alcohol, or iodine. ?If you have a wound, scrape, corn, or callus on your foot, look at it several times a day to make sure it is healing and not infected. Check for: ?Redness, swelling, or pain. ?Fluid or blood. ?Warmth. ?Pus or a bad smell. ?General tips ?Do not cross your legs. This may decrease blood flow to your feet. ?Do not use heating pads or hot water bottles on your feet. They may burn your skin. If you have lost feeling in your feet or legs, you may not know this is happening until it is too late. ?Protect your feet from hot and cold by wearing shoes, such as at the beach or on hot pavement. ?Schedule a complete foot exam at least once a year (annually) or more often if you have foot problems. Report any cuts, sores, or bruises to your health care provider immediately. ?Where to find more information ?American Diabetes Association: www.diabetes.org ?Association of Diabetes Care & Education Specialists: www.diabeteseducator.org ?Contact a health care provider if: ?You have a medical condition that increases your risk of infection and you have any cuts, sores, or bruises on your feet. ?You have an injury that is not healing. ?You  have redness on your legs or feet. ?You feel burning or tingling in your legs or feet. ?You have pain or cramps in your legs and feet. ?Your legs or feet are numb. ?Your feet always feel cold. ?You have pain around any toenails. ?Get help right away if: ?You have a wound, scrape, corn, or callus on your foot and: ?You have pain, swelling, or redness that gets worse. ?You have fluid or blood coming from the wound, scrape, corn, or callus. ?Your wound, scrape, corn, or callus feels warm to the touch. ?You have pus or a bad smell coming from the wound, scrape, corn, or  callus. ?You have a fever. ?You have a red line going up your leg. ?Summary ?Check your feet every day for blisters, cuts, bruises, sores, and redness. ?Apply a moisturizing lotion or petroleum jelly to the skin on your feet and to dry, brittle toenails. ?Wear shoes that fit properly and have enough cushioning. ?If you have foot problems, report any cuts, sores, or bruises to your health care provider immediately. ?Schedule a complete foot exam at least once a year (annually) or more often if you have foot problems. ?This information is not intended to replace advice given to you by your health care provider. Make sure you discuss any questions you have with your health care provider. ?Document Revised: 11/19/2019 Document Reviewed: 11/19/2019 ?[employer] Patient Education ? 2022 [employer] ? ?

## 2021-10-25 DIAGNOSIS — F4321 Adjustment disorder with depressed mood: Secondary | ICD-10-CM | POA: Diagnosis not present

## 2021-10-25 DIAGNOSIS — I10 Essential (primary) hypertension: Secondary | ICD-10-CM | POA: Diagnosis not present

## 2021-10-25 DIAGNOSIS — E1165 Type 2 diabetes mellitus with hyperglycemia: Secondary | ICD-10-CM | POA: Diagnosis not present

## 2021-10-25 DIAGNOSIS — F411 Generalized anxiety disorder: Secondary | ICD-10-CM | POA: Diagnosis not present

## 2021-11-21 ENCOUNTER — Ambulatory Visit: Payer: BC Managed Care – PPO | Admitting: Podiatry

## 2022-01-02 DIAGNOSIS — F411 Generalized anxiety disorder: Secondary | ICD-10-CM | POA: Diagnosis not present

## 2022-01-02 DIAGNOSIS — I10 Essential (primary) hypertension: Secondary | ICD-10-CM | POA: Diagnosis not present

## 2022-01-02 DIAGNOSIS — E1165 Type 2 diabetes mellitus with hyperglycemia: Secondary | ICD-10-CM | POA: Diagnosis not present

## 2022-01-02 DIAGNOSIS — N3281 Overactive bladder: Secondary | ICD-10-CM | POA: Diagnosis not present

## 2022-08-02 DIAGNOSIS — E669 Obesity, unspecified: Secondary | ICD-10-CM | POA: Diagnosis not present

## 2022-08-02 DIAGNOSIS — Z1331 Encounter for screening for depression: Secondary | ICD-10-CM | POA: Diagnosis not present

## 2022-08-02 DIAGNOSIS — F419 Anxiety disorder, unspecified: Secondary | ICD-10-CM | POA: Diagnosis not present

## 2022-08-02 DIAGNOSIS — L309 Dermatitis, unspecified: Secondary | ICD-10-CM | POA: Diagnosis not present

## 2022-08-02 DIAGNOSIS — N3281 Overactive bladder: Secondary | ICD-10-CM | POA: Diagnosis not present

## 2022-08-02 DIAGNOSIS — R54 Age-related physical debility: Secondary | ICD-10-CM | POA: Diagnosis not present

## 2022-08-02 DIAGNOSIS — E785 Hyperlipidemia, unspecified: Secondary | ICD-10-CM | POA: Diagnosis not present

## 2022-08-02 DIAGNOSIS — M1711 Unilateral primary osteoarthritis, right knee: Secondary | ICD-10-CM | POA: Diagnosis not present

## 2022-08-02 DIAGNOSIS — K9289 Other specified diseases of the digestive system: Secondary | ICD-10-CM | POA: Diagnosis not present

## 2022-08-02 DIAGNOSIS — I10 Essential (primary) hypertension: Secondary | ICD-10-CM | POA: Diagnosis not present

## 2022-08-02 DIAGNOSIS — H900 Conductive hearing loss, bilateral: Secondary | ICD-10-CM | POA: Diagnosis not present

## 2022-08-02 DIAGNOSIS — E1165 Type 2 diabetes mellitus with hyperglycemia: Secondary | ICD-10-CM | POA: Diagnosis not present

## 2022-08-02 DIAGNOSIS — Z0000 Encounter for general adult medical examination without abnormal findings: Secondary | ICD-10-CM | POA: Diagnosis not present

## 2022-08-24 DIAGNOSIS — I10 Essential (primary) hypertension: Secondary | ICD-10-CM | POA: Diagnosis not present

## 2022-08-24 DIAGNOSIS — F411 Generalized anxiety disorder: Secondary | ICD-10-CM | POA: Diagnosis not present

## 2022-08-24 DIAGNOSIS — E1165 Type 2 diabetes mellitus with hyperglycemia: Secondary | ICD-10-CM | POA: Diagnosis not present

## 2022-09-19 DIAGNOSIS — E119 Type 2 diabetes mellitus without complications: Secondary | ICD-10-CM | POA: Diagnosis not present

## 2023-01-22 DIAGNOSIS — L0291 Cutaneous abscess, unspecified: Secondary | ICD-10-CM | POA: Diagnosis not present

## 2023-01-22 DIAGNOSIS — I10 Essential (primary) hypertension: Secondary | ICD-10-CM | POA: Diagnosis not present

## 2023-01-22 DIAGNOSIS — Z23 Encounter for immunization: Secondary | ICD-10-CM | POA: Diagnosis not present

## 2023-01-22 DIAGNOSIS — E785 Hyperlipidemia, unspecified: Secondary | ICD-10-CM | POA: Diagnosis not present

## 2023-01-22 DIAGNOSIS — E1165 Type 2 diabetes mellitus with hyperglycemia: Secondary | ICD-10-CM | POA: Diagnosis not present

## 2023-02-21 DIAGNOSIS — Z872 Personal history of diseases of the skin and subcutaneous tissue: Secondary | ICD-10-CM | POA: Diagnosis not present

## 2023-02-21 DIAGNOSIS — I10 Essential (primary) hypertension: Secondary | ICD-10-CM | POA: Diagnosis not present

## 2023-02-21 DIAGNOSIS — E785 Hyperlipidemia, unspecified: Secondary | ICD-10-CM | POA: Diagnosis not present

## 2023-03-18 DIAGNOSIS — E1165 Type 2 diabetes mellitus with hyperglycemia: Secondary | ICD-10-CM | POA: Diagnosis not present

## 2023-03-18 DIAGNOSIS — Z807 Family history of other malignant neoplasms of lymphoid, hematopoietic and related tissues: Secondary | ICD-10-CM | POA: Diagnosis not present

## 2023-03-18 DIAGNOSIS — I10 Essential (primary) hypertension: Secondary | ICD-10-CM | POA: Diagnosis not present

## 2023-03-18 DIAGNOSIS — Z806 Family history of leukemia: Secondary | ICD-10-CM | POA: Diagnosis not present

## 2023-07-15 DIAGNOSIS — I10 Essential (primary) hypertension: Secondary | ICD-10-CM | POA: Diagnosis not present

## 2023-07-15 DIAGNOSIS — M129 Arthropathy, unspecified: Secondary | ICD-10-CM | POA: Diagnosis not present

## 2023-09-30 ENCOUNTER — Ambulatory Visit: Attending: Cardiovascular Disease | Admitting: Cardiovascular Disease

## 2023-10-01 ENCOUNTER — Encounter: Payer: Self-pay | Admitting: Cardiovascular Disease

## 2023-12-23 DIAGNOSIS — E785 Hyperlipidemia, unspecified: Secondary | ICD-10-CM | POA: Diagnosis not present

## 2023-12-23 DIAGNOSIS — E559 Vitamin D deficiency, unspecified: Secondary | ICD-10-CM | POA: Diagnosis not present

## 2023-12-23 DIAGNOSIS — Z1331 Encounter for screening for depression: Secondary | ICD-10-CM | POA: Diagnosis not present

## 2023-12-23 DIAGNOSIS — I10 Essential (primary) hypertension: Secondary | ICD-10-CM | POA: Diagnosis not present

## 2023-12-23 DIAGNOSIS — E1165 Type 2 diabetes mellitus with hyperglycemia: Secondary | ICD-10-CM | POA: Diagnosis not present

## 2023-12-23 DIAGNOSIS — Z136 Encounter for screening for cardiovascular disorders: Secondary | ICD-10-CM | POA: Diagnosis not present

## 2023-12-23 DIAGNOSIS — Z23 Encounter for immunization: Secondary | ICD-10-CM | POA: Diagnosis not present

## 2023-12-23 DIAGNOSIS — F411 Generalized anxiety disorder: Secondary | ICD-10-CM | POA: Diagnosis not present

## 2023-12-23 DIAGNOSIS — H900 Conductive hearing loss, bilateral: Secondary | ICD-10-CM | POA: Diagnosis not present

## 2023-12-23 DIAGNOSIS — Z0000 Encounter for general adult medical examination without abnormal findings: Secondary | ICD-10-CM | POA: Diagnosis not present

## 2023-12-23 DIAGNOSIS — M25512 Pain in left shoulder: Secondary | ICD-10-CM | POA: Diagnosis not present

## 2024-01-14 DIAGNOSIS — E1165 Type 2 diabetes mellitus with hyperglycemia: Secondary | ICD-10-CM | POA: Diagnosis not present

## 2024-01-14 DIAGNOSIS — Z0000 Encounter for general adult medical examination without abnormal findings: Secondary | ICD-10-CM | POA: Diagnosis not present

## 2024-02-04 DIAGNOSIS — E119 Type 2 diabetes mellitus without complications: Secondary | ICD-10-CM | POA: Diagnosis not present

## 2024-03-19 DIAGNOSIS — I10 Essential (primary) hypertension: Secondary | ICD-10-CM | POA: Diagnosis not present

## 2024-03-19 DIAGNOSIS — F419 Anxiety disorder, unspecified: Secondary | ICD-10-CM | POA: Diagnosis not present

## 2024-03-19 DIAGNOSIS — E118 Type 2 diabetes mellitus with unspecified complications: Secondary | ICD-10-CM | POA: Diagnosis not present
